# Patient Record
Sex: Female | Born: 1960 | Race: Asian | Hispanic: No | Marital: Married | Smoking: Never smoker
Health system: Southern US, Community
[De-identification: ages and names within clinical notes are randomized; demographics above are authoritative.]

## PROBLEM LIST (undated history)

## (undated) DIAGNOSIS — K37 Unspecified appendicitis: Secondary | ICD-10-CM

## (undated) DIAGNOSIS — R1031 Right lower quadrant pain: Secondary | ICD-10-CM

## (undated) DIAGNOSIS — K5900 Constipation, unspecified: Secondary | ICD-10-CM

## (undated) DIAGNOSIS — D219 Benign neoplasm of connective and other soft tissue, unspecified: Secondary | ICD-10-CM

## (undated) DIAGNOSIS — E785 Hyperlipidemia, unspecified: Secondary | ICD-10-CM

## (undated) DIAGNOSIS — R110 Nausea: Secondary | ICD-10-CM

## (undated) DIAGNOSIS — K649 Unspecified hemorrhoids: Secondary | ICD-10-CM

## (undated) DIAGNOSIS — M1990 Unspecified osteoarthritis, unspecified site: Secondary | ICD-10-CM

## (undated) DIAGNOSIS — Z87898 Personal history of other specified conditions: Secondary | ICD-10-CM

## (undated) HISTORY — PX: APPENDECTOMY: SHX54

## (undated) HISTORY — DX: Constipation, unspecified: K59.00

## (undated) HISTORY — DX: Nausea: R11.0

## (undated) HISTORY — DX: Personal history of other specified conditions: Z87.898

## (undated) HISTORY — DX: Benign neoplasm of connective and other soft tissue, unspecified: D21.9

## (undated) HISTORY — DX: Unspecified osteoarthritis, unspecified site: M19.90

## (undated) HISTORY — DX: Hyperlipidemia, unspecified: E78.5

## (undated) HISTORY — PX: COLONOSCOPY: SHX174

## (undated) HISTORY — PX: FINGER SURGERY: SHX640

## (undated) HISTORY — DX: Unspecified appendicitis: K37

## (undated) HISTORY — PX: POLYPECTOMY: SHX149

## (undated) HISTORY — DX: Right lower quadrant pain: R10.31

## (undated) HISTORY — DX: Unspecified hemorrhoids: K64.9

---

## 1999-10-09 DIAGNOSIS — R110 Nausea: Secondary | ICD-10-CM

## 1999-10-09 DIAGNOSIS — R1031 Right lower quadrant pain: Secondary | ICD-10-CM

## 1999-10-09 HISTORY — DX: Right lower quadrant pain: R10.31

## 1999-10-09 HISTORY — DX: Nausea: R11.0

## 2000-05-24 ENCOUNTER — Other Ambulatory Visit: Admission: RE | Admit: 2000-05-24 | Discharge: 2000-05-24 | Payer: Self-pay | Admitting: Obstetrics & Gynecology

## 2000-08-15 ENCOUNTER — Encounter (INDEPENDENT_AMBULATORY_CARE_PROVIDER_SITE_OTHER): Payer: Self-pay | Admitting: Specialist

## 2000-08-15 ENCOUNTER — Observation Stay (HOSPITAL_COMMUNITY): Admission: EM | Admit: 2000-08-15 | Discharge: 2000-08-16 | Payer: Self-pay | Admitting: Emergency Medicine

## 2001-07-08 ENCOUNTER — Other Ambulatory Visit: Admission: RE | Admit: 2001-07-08 | Discharge: 2001-07-08 | Payer: Self-pay | Admitting: Obstetrics & Gynecology

## 2002-07-28 ENCOUNTER — Other Ambulatory Visit: Admission: RE | Admit: 2002-07-28 | Discharge: 2002-07-28 | Payer: Self-pay | Admitting: Obstetrics & Gynecology

## 2003-06-08 ENCOUNTER — Encounter: Admission: RE | Admit: 2003-06-08 | Discharge: 2003-06-15 | Payer: Self-pay | Admitting: *Deleted

## 2003-09-06 ENCOUNTER — Other Ambulatory Visit: Admission: RE | Admit: 2003-09-06 | Discharge: 2003-09-06 | Payer: Self-pay | Admitting: Obstetrics & Gynecology

## 2004-09-12 ENCOUNTER — Other Ambulatory Visit: Admission: RE | Admit: 2004-09-12 | Discharge: 2004-09-12 | Payer: Self-pay | Admitting: Obstetrics & Gynecology

## 2005-09-19 ENCOUNTER — Other Ambulatory Visit: Admission: RE | Admit: 2005-09-19 | Discharge: 2005-09-19 | Payer: Self-pay | Admitting: Obstetrics & Gynecology

## 2007-06-12 ENCOUNTER — Encounter: Admission: RE | Admit: 2007-06-12 | Discharge: 2007-06-12 | Payer: Self-pay | Admitting: Dermatology

## 2007-07-01 ENCOUNTER — Ambulatory Visit (HOSPITAL_BASED_OUTPATIENT_CLINIC_OR_DEPARTMENT_OTHER): Admission: RE | Admit: 2007-07-01 | Discharge: 2007-07-01 | Payer: Self-pay | Admitting: Orthopedic Surgery

## 2007-07-01 ENCOUNTER — Encounter (INDEPENDENT_AMBULATORY_CARE_PROVIDER_SITE_OTHER): Payer: Self-pay | Admitting: Orthopedic Surgery

## 2009-05-06 ENCOUNTER — Encounter: Payer: Self-pay | Admitting: Family Medicine

## 2009-08-04 ENCOUNTER — Ambulatory Visit: Payer: Self-pay | Admitting: Family Medicine

## 2009-08-04 DIAGNOSIS — D759 Disease of blood and blood-forming organs, unspecified: Secondary | ICD-10-CM | POA: Insufficient documentation

## 2009-08-04 DIAGNOSIS — E785 Hyperlipidemia, unspecified: Secondary | ICD-10-CM | POA: Insufficient documentation

## 2009-08-05 LAB — CONVERTED CEMR LAB
ALT: 24 units/L (ref 0–35)
Alkaline Phosphatase: 52 units/L (ref 39–117)
Basophils Absolute: 0 10*3/uL (ref 0.0–0.1)
Bilirubin, Direct: 0.1 mg/dL (ref 0.0–0.3)
Cholesterol: 189 mg/dL (ref 0–200)
HCT: 40.2 % (ref 36.0–46.0)
Hemoglobin: 13.4 g/dL (ref 12.0–15.0)
LDL Cholesterol: 107 mg/dL — ABNORMAL HIGH (ref 0–99)
Lymphs Abs: 2.1 10*3/uL (ref 0.7–4.0)
MCV: 88.3 fL (ref 78.0–100.0)
Monocytes Absolute: 0.6 10*3/uL (ref 0.1–1.0)
Monocytes Relative: 7.8 % (ref 3.0–12.0)
Neutro Abs: 4.3 10*3/uL (ref 1.4–7.7)
Platelets: 370 10*3/uL (ref 150.0–400.0)
RDW: 14.7 % — ABNORMAL HIGH (ref 11.5–14.6)
Total Bilirubin: 1 mg/dL (ref 0.3–1.2)
VLDL: 22.2 mg/dL (ref 0.0–40.0)

## 2009-08-22 ENCOUNTER — Ambulatory Visit: Payer: Self-pay | Admitting: Family Medicine

## 2010-02-05 LAB — CONVERTED CEMR LAB: Pap Smear: NORMAL

## 2010-08-11 ENCOUNTER — Ambulatory Visit: Payer: Self-pay | Admitting: Family Medicine

## 2010-08-11 LAB — CONVERTED CEMR LAB
AST: 20 units/L (ref 0–37)
Alkaline Phosphatase: 60 units/L (ref 39–117)
Basophils Relative: 0.6 % (ref 0.0–3.0)
Bilirubin Urine: NEGATIVE
Bilirubin, Direct: 0.1 mg/dL (ref 0.0–0.3)
Calcium: 9 mg/dL (ref 8.4–10.5)
Cholesterol: 216 mg/dL — ABNORMAL HIGH (ref 0–200)
Creatinine, Ser: 0.7 mg/dL (ref 0.4–1.2)
Eosinophils Absolute: 0.2 10*3/uL (ref 0.0–0.7)
Eosinophils Relative: 3.2 % (ref 0.0–5.0)
GFR calc non Af Amer: 99.81 mL/min (ref >60)
Ketones, urine, test strip: NEGATIVE
Lymphocytes Relative: 31.7 % (ref 12.0–46.0)
Monocytes Relative: 7.3 % (ref 3.0–12.0)
Neutrophils Relative %: 57.2 % (ref 43.0–77.0)
Nitrite: NEGATIVE
Protein, U semiquant: NEGATIVE
RBC: 4.39 M/uL (ref 3.87–5.11)
Sodium: 141 meq/L (ref 135–145)
Total CHOL/HDL Ratio: 4
Total Protein: 6.5 g/dL (ref 6.0–8.3)
Triglycerides: 167 mg/dL — ABNORMAL HIGH (ref 0.0–149.0)
Urobilinogen, UA: 0.2
VLDL: 33.4 mg/dL (ref 0.0–40.0)
WBC: 6 10*3/uL (ref 4.5–10.5)

## 2010-08-22 ENCOUNTER — Ambulatory Visit: Payer: Self-pay | Admitting: Family Medicine

## 2010-11-07 NOTE — Assessment & Plan Note (Signed)
Summary: cpx//ccm   Vital Signs:  Patient profile:   50 year old female Menstrual status:  regular Height:      62.50 inches Weight:      126 pounds BMI:     22.76 Temp:     98.6 degrees F oral Pulse rate:   72 / minute Pulse rhythm:   regular Resp:     12 per minute BP sitting:   100 / 64  (left arm) Cuff size:   regular  Vitals Entered By: Sid Falcon LPN (August 22, 2010 10:05 AM)  History of Present Illness: Here for CPE.  Patient sees gynecologist and had Pap smear and mammogram last May which were normal. She has history of hyperlipidemia and reported history of mild thrombocytosis. She has also been diagnosed with uterine fibroids followed closely by gynecologist. Sometimes has heavy menses.  Family history mother had lung cancer. Father died of stroke complications and had type 2 diabetes. Brothers with hypertension and hyperlipidemia.  Last tetanus unknown. She thinks greater than 10 years. Exercises 4 days per week.  Clinical Review Panels:  Prevention   Last Mammogram:  normal (02/05/2010)   Last Pap Smear:  normal (02/05/2010)  Immunizations   Last Tetanus Booster:  Tdap (08/22/2010)   Last Flu Vaccine:  Historical (08/08/2010)  Lipid Management   Cholesterol:  216 (08/11/2010)   LDL (bad choesterol):  107 (08/04/2009)   HDL (good cholesterol):  59.00 (08/11/2010)  Diabetes Management   Creatinine:  0.7 (08/11/2010)   Last Flu Vaccine:  Historical (08/08/2010)  CBC   WBC:  6.0 (08/11/2010)   RBC:  4.39 (08/11/2010)   Hgb:  12.8 (08/11/2010)   Hct:  38.8 (08/11/2010)   Platelets:  379.0 (08/11/2010)   MCV  88.4 (08/11/2010)   MCHC  33.0 (08/11/2010)   RDW  13.7 (08/11/2010)   PMN:  57.2 (08/11/2010)   Lymphs:  31.7 (08/11/2010)   Monos:  7.3 (08/11/2010)   Eosinophils:  3.2 (08/11/2010)   Basophil:  0.6 (08/11/2010)  Complete Metabolic Panel   Glucose:  100 (08/11/2010)   Sodium:  141 (08/11/2010)   Potassium:  4.4 (08/11/2010)  Chloride:  106 (08/11/2010)   CO2:  29 (08/11/2010)   BUN:  14 (08/11/2010)   Creatinine:  0.7 (08/11/2010)   Albumin:  4.0 (08/11/2010)   Total Protein:  6.5 (08/11/2010)   Calcium:  9.0 (08/11/2010)   Total Bili:  0.5 (08/11/2010)   Alk Phos:  60 (08/11/2010)   SGPT (ALT):  18 (08/11/2010)   SGOT (AST):  20 (08/11/2010)   Allergies: No Known Drug Allergies  Past History:  Past Surgical History: Last updated: 08/04/2009 C-Section X 2  Family History: Last updated: 08/22/2010 Family History Diabetes Father Family History Hypertension  Brothers Family History Lung cancer Mother Family History of Stroke Father died 42  Social History: Last updated: 08/04/2009 Occupation:  Homemaker Married Never Smoked Alcohol use-no Regular exercise-yes  Risk Factors: Exercise: yes (08/04/2009)  Risk Factors: Smoking Status: never (08/04/2009)  Past Medical History: Chicken pox Hyperlipidemia uterine fibroids PMH-FH-SH reviewed for relevance  Family History: Family History Diabetes Father Family History Hypertension  Brothers Family History Lung cancer Mother Family History of Stroke Father died 69  Review of Systems  The patient denies anorexia, fever, weight loss, weight gain, vision loss, decreased hearing, hoarseness, chest pain, syncope, dyspnea on exertion, peripheral edema, prolonged cough, headaches, hemoptysis, abdominal pain, melena, hematochezia, severe indigestion/heartburn, hematuria, incontinence, genital sores, muscle weakness, suspicious skin lesions, transient blindness, difficulty walking,  depression, unusual weight change, abnormal bleeding, enlarged lymph nodes, and breast masses.    Physical Exam  General:  Well-developed,well-nourished,in no acute distress; alert,appropriate and cooperative throughout examination Head:  Normocephalic and atraumatic without obvious abnormalities. No apparent alopecia or balding. Eyes:  No corneal or conjunctival  inflammation noted. EOMI. Perrla. Funduscopic exam benign, without hemorrhages, exudates or papilledema. Vision grossly normal. Ears:  External ear exam shows no significant lesions or deformities.  Otoscopic examination reveals clear canals, tympanic membranes are intact bilaterally without bulging, retraction, inflammation or discharge. Hearing is grossly normal bilaterally. Mouth:  Oral mucosa and oropharynx without lesions or exudates.  Teeth in good repair. Neck:  No deformities, masses, or tenderness noted. Breasts:  gyn Lungs:  Normal respiratory effort, chest expands symmetrically. Lungs are clear to auscultation, no crackles or wheezes. Heart:  Normal rate and regular rhythm. S1 and S2 normal without gallop, murmur, click, rub or other extra sounds. Abdomen:  Bowel sounds positive,abdomen soft and non-tender without masses, organomegaly or hernias noted. Genitalia:  gyn Msk:  No deformity or scoliosis noted of thoracic or lumbar spine.   Extremities:  No clubbing, cyanosis, edema, or deformity noted with normal full range of motion of all joints.   Neurologic:  alert & oriented X3 and gait normal.   Skin:  no rashes and no suspicious lesions.   Cervical Nodes:  No lymphadenopathy noted Psych:  normally interactive, good eye contact, not anxious appearing, and not depressed appearing.     Impression & Recommendations:  Problem # 1:  Preventive Health Care (ICD-V70.0) labs reviewed with pt.  Tdap given.  Problem # 2:  HYPERLIPIDEMIA (ICD-272.4)  Her updated medication list for this problem includes:    Crestor 10 Mg Tabs (Rosuvastatin calcium) ..... One by mouth once daily  Complete Medication List: 1)  Crestor 10 Mg Tabs (Rosuvastatin calcium) .... One by mouth once daily 2)  Bayer Low Strength 81 Mg Tbec (Aspirin) .... Once daily  Other Orders: Tdap => 28yrs IM (16109) Admin 1st Vaccine (60454)  Patient Instructions: 1)  Please schedule a follow-up appointment in 1 year.    2)  It is important that you exercise reguarly at least 20 minutes 5 times a week. If you develop chest pain, have severe difficulty breathing, or feel very tired, stop exercising immediately and seek medical attention.  3)  Continue regular gyn follow up Prescriptions: CRESTOR 10 MG TABS (ROSUVASTATIN CALCIUM) one by mouth once daily  #30 x 6   Entered and Authorized by:   Evelena Peat MD   Signed by:   Evelena Peat MD on 08/22/2010   Method used:   Electronically to        [facility]  [place] [address] (retail)       [address]       [place], [place]  [address]       Ph: [phone] or [phone]       Fax: [phone]   RxID:   [id number]    Orders Added: 1)  Tdap => 48yrs IM [90715] 2)  Admin 1st Vaccine [90471] 3)  Est. Patient 40-64 years [34742]   Immunization History:  Influenza Immunization History:    Influenza:  historical (08/08/2010)  Immunizations Administered:  Tetanus Vaccine:    Vaccine Type: Tdap    Site: left deltoid    Mfr: GlaxoSmithKline    Dose: 0.5 ml    Route: IM    Given by: Sid Falcon LPN    Exp.  Date: 06/27/2012    Lot #: [id number]    VIS given: 08/25/08 version given August 22, 2010.   Immunization History:  Influenza Immunization History:    Influenza:  Historical (08/08/2010)  Immunizations Administered:  Tetanus Vaccine:    Vaccine Type: Tdap    Site: left deltoid    Mfr: GlaxoSmithKline    Dose: 0.5 ml    Route: IM    Given by: Sid Falcon LPN    Exp. Date: 06/27/2012    Lot #: [id number]    VIS given: 08/25/08 version given August 22, 2010.   Preventive Care Screening  Mammogram:    Date:  02/05/2010    Results:  normal   Pap Smear:    Date:  02/05/2010    Results:  normal

## 2011-01-31 ENCOUNTER — Other Ambulatory Visit: Payer: Self-pay | Admitting: Obstetrics and Gynecology

## 2011-01-31 DIAGNOSIS — R1900 Intra-abdominal and pelvic swelling, mass and lump, unspecified site: Secondary | ICD-10-CM

## 2011-02-06 ENCOUNTER — Ambulatory Visit
Admission: RE | Admit: 2011-02-06 | Discharge: 2011-02-06 | Disposition: A | Discharge status: Home / Self Care | Payer: BC Managed Care – PPO | Source: Ambulatory Visit | Attending: Obstetrics and Gynecology | Admitting: Obstetrics and Gynecology

## 2011-02-06 DIAGNOSIS — R1900 Intra-abdominal and pelvic swelling, mass and lump, unspecified site: Secondary | ICD-10-CM

## 2011-02-06 MED ORDER — IOHEXOL 300 MG/ML  SOLN
100.0000 mL | Freq: Once | INTRAMUSCULAR | Status: AC | PRN
  Administered 2011-02-06: 100 mL via INTRAVENOUS

## 2011-02-20 NOTE — Op Note (Signed)
NAMESHAHIDAH, Shelley Rhodes                     ACCOUNT NO.:  [id number]   MEDICAL RECORD NO.:  [id number]          PATIENT TYPE:  AMB   LOCATION:  DSC                          FACILITY:  [facility]   PHYSICIAN:  Katy Fitch. Sypher, M.D. DATE OF BIRTH:  June 14, 1961   DATE OF PROCEDURE:  07/01/2007  DATE OF DISCHARGE:                               OPERATIVE REPORT   PREOPERATIVE DIAGNOSIS:  Manson Passey discolored, deformed right ring finger  ulnar nail plate with inflammation of nail fold, rule out melanotic  lesion versus fungal infection versus glomus tumor, right ring nail fold  and nail matrix.   POSTOPERATIVE DIAGNOSIS:  Probable fungal nail infection, rule out  glomus tumor.   OPERATION:  1. Right ring finger nail plate excision with nail plate being sent to      lab for KOH prep to look for fungal species followed by nail plate      culture.  2. Excisional biopsy of nail matrix to rule out a possible glomus      tumor.   SURGEON:  Katy Fitch. Sypher, M.D.   ASSISTANT:  Marveen Reeks. Dasnoit, P.A.-C.   ANESTHESIA:  2% lidocaine metacarpal head level block of right ring  finger supplemented by IV sedation followed by a postoperative 0.25%  Marcaine digital block for post surgical comfort.   INDICATIONS:  Shelley Rhodes is a 50 year old woman referred through the  courtesy of Dr. Venancio Poisson, dermatologist, for evaluation and  management of a right ring finger nail deformity.  Shelley Rhodes had noted a  brownish discoloration of the ulnar 3 mm of her nail plate with a groove  and discomfort along the nail wall.  Dr. Nicholas Lose reviewed this and was  concerned about a possible melanin producing lesion versus trauma versus  possible fungal infection.  She referred Shelley Rhodes for an orthopedic hand  surgery consult and possible nail biopsy.   On examination in the office, Shelley Rhodes was noted to have a nail groove  with evidence of hemosiderin staining verses melanin staining of the  nail plate extending back to the germinal nail  matrix.  Due to our  inability to rule out a melanin producing lesion, we recommended  proceeding with excisional biopsy at this time.  After informed consent,  Shelley Rhodes is brought to operating room.  Preoperatively, she was advised  that she would possibly have narrowing of her nail plate and matrix  following the biopsy.  She also understands that she may have a chronic  nail irregularity after nail biopsy.   PROCEDURE:  Shelley Rhodes is brought to the operating room and placed in the  supine position on the operating table.  Following the induction of  general sedation, the right arm was prepped with Betadine soap solution  and sterilely draped.  2% lidocaine was infiltrated at the metacarpal  head level to obtain digital block.  The procedure commenced with  exsanguination of the right ring finger with a gauze wrap and placement  of a 1/2 inch Penrose drain as a digital tourniquet over the proximal  phalangeal  segment of the finger.   The ulnar nail plate was elevated with a micro-Freer and a segment of  the nail measuring 3 mm in width was excised incorporating all of the  brownish discoloration.  The nail was inverted and the deep surface  inspected.  There appeared to be some hyperkeratosis of the nail plate  as well as some irregularity of the nail matrix suggesting either fungal  infection of prior trauma.  The nail plate was sent to pathology for a  KOH prep and culture of the nail plate.  The nail matrix was carefully  inspected and a purplish area was identified adjacent to the abnormal  appearing nail that could have represented a possible glomus tumor.  The  nail deformity was circumferentially dissected and excised.  This was  passed off in formalin for pathologic evaluation.   The margins of the sterile nail matrix was then elevated, mobilized, and  repaired with interrupted sutures of 7-0 chromic.  The dorsal nail fold  was inspected proximally and there was no sign of a mass  or other  melanin containing lesion on the deep surface of the nail fold or in the  germinal nail matrix.  The wound was then irrigated, the nail plate  replaced anatomically, and the finger dressed with Xeroflow, sterile  gauze, and Coban.  A 0.25% Marcaine block without epinephrine was placed  for postoperative analgesia at the metacarpal head level.  There were no  apparent complications with the block.   Shelley Rhodes was awakened from sedation and transferred to the recovery room  with stable vital signs.  She will be discharged home with a  prescription for Percocet 5 mg one p.o. q.4-6h. p.r.n. pain, 20 tablets  without refill.      Katy Fitch Sypher, M.D.  Electronically Signed     RVS/MEDQ  D:  07/01/2007  T:  07/01/2007  Job:  [id number]   cc:   Venancio Poisson, M.D.

## 2011-02-23 NOTE — H&P (Signed)
[facility]  Patient:    Shelley Rhodes, Shelley Rhodes                           MRN: [id number] Adm. Date:  08/15/00 Attending:  Angelia Mould. Derrell Lolling, M.D. CC:         Al Decant. Janey Greaser, M.D.   History and Physical  CHIEF COMPLAINT:  Right lower quadrant abdominal pain and nausea.  HISTORY OF PRESENT ILLNESS:  This is a 50 year old [place] female, previously in excellent health.  Last night after dinner, she developed a diffuse centralized abdominal pain, dizziness, and nausea, but she did not vomit.  She did not sleep well because of pain.  The pain has become progressive and has become more localized and more intense in the right lower quadrant.  She had two small semi-formed stools over the past 24 hours.  She has not had any prior similar episodes of pain like this.  She denies any prior gastrointestinal problems.  Her last menstrual period was July 17, 2000 and was normal.  She has no voiding symptoms.  She was seen by Dr. Doran Clay in his office today.  He was concerned that she had appendicitis.  A CBC was obtained and showed a white blood cell count of 15,000 and a hemoglobin of 15.0.  Urinalysis was normal.  Urine pregnancy test was negative.  Complete metabolic panel is normal.  She was admitted with a tentative diagnosis of acute appendicitis.  PAST MEDICAL HISTORY:  She has a history of heartburn.  She has had two cesarean sections, one in 1988 and another in 1993.  She has not had any surgical problems otherwise.  She has no medical problems.  CURRENT MEDICATIONS:  None.  DRUG ALLERGIES:  None known.  SOCIAL HISTORY:  The patient lives in [place] with her husband and two children.  She denies the use of alcohol or tobacco.  She is a housewife.  Her husband works for _________ as a [employer] manager.  FAMILY HISTORY:  Mother deceased of lung cancer.  Father living, had a stroke, hypertension, and diabetes.  Two brothers living and  well.  REVIEW OF SYSTEMS:  All systems are reviewed and are negative except as described above.  PHYSICAL EXAMINATION:  GENERAL:  Healthy young [place] female in moderate distress with abdominal pain.  VITAL SIGNS:  Temperature 97.2, pulse 80, respirations 16, blood pressure 106/71.  HEENT:  Sclerae clear.  Extraocular movements intact.  Oropharynx clear without lesions.  NECK:  Supple, nontender, no mass, no adenopathy, no thyromegaly, no bruit.  LUNGS:  Clear to auscultation, no CVA tenderness.  HEART:  Regular rate and rhythm, no murmur.  ABDOMEN:  Not distended.  Soft.  Hypoactive bowel sounds.  She has marked localized tenderness, involuntary guarding, direct and referred rebound in the right lower quadrant.  There is no mass.  There is no hernia.  GENITALIA:  Pelvic exam unremarkable according to Dr. Janey Greaser.  EXTREMITIES:  No edema, good pulses.  NEUROLOGIC:  Grossly within normal limits.  IMPRESSION:  Acute appendicitis.  PLAN: 1. The patient will be taken to the operating room for diagnostic laparoscopy    and probably laparoscopic appendectomy. 2. I have discussed the differential diagnosis with the patient and her    husband.  They are aware that this is most likely appendicitis, but I    cannot rule out diverticulitis, salpingitis, or other surgical problems.    The differential diagnosis was discussed in  detail.  I discussed the    indications and details of surgery with them.  Risks and complications,    including but not limited to, bleeding, infection, wound problems,    conversion to open laparotomy, possible intestinal surgery, and other    known preceding complications.  They seem to understand these issues quite    well.  At this time all of their questions were answered.  They agree with    the plan. DD:  08/15/00 TD:  08/15/00 Job: [id number]

## 2011-02-23 NOTE — Op Note (Signed)
[facility]  Patient:    Shelley Rhodes, Shelley Rhodes                           MRN: [id number] Proc. Date: 08/15/00 Adm. Date:  [id number] Attending:  Brandy Hale CC:         Al Decant. Janey Greaser, M.D.   Operative Report  PREOPERATIVE DIAGNOSIS:  Acute appendicitis.  POSTOPERATIVE DIAGNOSIS:  Acute appendicitis.  OPERATION:  Laparoscopic appendectomy.  SURGEON:  Angelia Mould. Derrell Lolling, M.D.  INDICATIONS:  This is a 50 year old [place] female who has enjoyed good health.  She presents today with an 18 to 20-hour history of lower abdominal pain which has been progressive and localized in the right lower quadrant.  On examination, she was very tender in the right lower quadrant.  White blood cell count was elevated to 15,000.  I felt that she had acute appendicitis, and she is brought to the operating room urgently.  INTRAOPERATIVE FINDINGS:  The patient had acute appendicitis.  The appendix was swollen, thickened, and had an exudate on it.  There was no evidence of rupture.  The colon, small intestine, uterus, and ovaries looked fine.  The liver and gallbladder looked normal.  OPERATIVE TECHNIQUE:  Following the induction of general endotracheal anesthesia, the patients abdomen was prepped and draped in sterile fashion. A Foley catheter had been inserted previously.  Marcaine 0.5% with epinephrine was used as a local infiltration anesthetic.  A vertical incision was made in the upper rim of the umbilicus.  The fascia was incised in the midline and the abdominal cavity entered under direct vision.  A 10 mm Hasson trocar was inserted and secured with a pursestring suture of 0 Vicryl.  Pneumoperitoneum was created.  Video camera was inserted with visualization and findings as described above.  A 5 mm trocar was placed in the right upper quadrant and a 12 mm trocar placed in the left lower quadrant.  The appendix was identified, mobilized, and controlled with an  endoloop tie.  Harmonic scalpel was used, and we divided the lateral peritoneal attachments to further mobilize the appendix.  The mesoappendix was divided with a harmonic scalpel.  We took all of this down until we could clearly identify the junction of the appendix with the cecum.  We inserted a Endo GIA stapling device using the small vascular staple load.  This was positioned across the base of the appendix, and the GIA stapler was closed, fired, and removed.  This provided a very secure closure on the cecum.  The appendix was placed in a specimen bag and removed.  The operative field and the right paracolic gutter and the pelvis were irrigated with saline.  The fluid was clear.  There was no bleeding.  Trocars were removed under direct vision, and there was no bleeding from the trocar sites. The pneumoperitoneum was released.  The fascia at the umbilicus was closed with 0 Vicryl suture.  The fascia in the left lower quadrant trocar site was closed with 0 Vicryl suture.  The incisions were irrigated with saline and the skin closed with subcuticular sutures of 4-0 Vicryl and Steri-Strips.  Clean bandages were placed and the patient taken to the recovery room in stable condition.  Estimated blood loss was about 10 cc.  Complications: None. Sponge, needle, and instrument counts were correct. DD:  08/15/00 TD:  08/16/00 Job: [id number]

## 2011-03-30 ENCOUNTER — Ambulatory Visit: Payer: BC Managed Care – PPO | Attending: Gynecology | Admitting: Gynecology

## 2011-03-30 DIAGNOSIS — E785 Hyperlipidemia, unspecified: Secondary | ICD-10-CM | POA: Insufficient documentation

## 2011-03-30 DIAGNOSIS — D259 Leiomyoma of uterus, unspecified: Secondary | ICD-10-CM | POA: Insufficient documentation

## 2011-03-30 DIAGNOSIS — R971 Elevated cancer antigen 125 [CA 125]: Secondary | ICD-10-CM | POA: Insufficient documentation

## 2011-04-02 NOTE — Consult Note (Signed)
Shelley Rhodes, Shelley Rhodes                     ACCOUNT NO.:  [id number]  MEDICAL RECORD NO.:  [id number]  LOCATION:  GYN                          FACILITY:  [facility]  PHYSICIAN:  De Blanch, M.D.DATE OF BIRTH:  12-Nov-1960  DATE OF CONSULTATION: DATE OF DISCHARGE:                                CONSULTATION   CHIEF COMPLAINT:  Elevated CA-125.  HISTORY OF PRESENT ILLNESS:  A 50 year old Asian woman seen in consultation at the request of Dr. Miguel Aschoff regarding elevated CA-125.  The patient was apparently visiting in [place], [place] in 2010 and underwent a complete physical exam, which included drawing of a CA-125 value.  At that time, the CA-125 returned 228 units/mL.  The patient recalls at that time she was having a heavy menstrual period and she has known uterine fibroids.  Subsequently, she came back to [place] where she has lived for 20 years.  Followup CA-125 in April 2010 was 49, in February 2011 was 39, and on Shianne 5, 2012 was 25.  Until recently, the patient was having regular cyclic menstrual periods.  More recently, she has been having prolonged time between her periods.  She has had some heavy menstrual periods in the past, but more recently have been relatively normal.  She denies any past history of any other gynecologic problems.  The patient has had a series of pelvic ultrasounds, which have documented uterine fibroids.  More recently, she had a CT scan of the abdomen and pelvis, which aside from the uterine fibroids showed a 3.3 cm left ovarian cyst.  There is no evidence of ascites, carcinomatosis or any adenopathy.  PAST MEDICAL HISTORY:  Medical illnesses, hyperlipidemia.  PAST SURGICAL HISTORY:  Cesarean section x2, appendectomy and surgery on her finger.  CURRENT MEDICATIONS:  Crestor.  DRUG ALLERGIES:  None.  OBSTETRICAL HISTORY:  Gravida 2.  FAMILY HISTORY:  Negative for gynecologic, breast or colon cancer.  SOCIAL HISTORY:  The patient is  married.  She does not smoke.  REVIEW OF SYSTEMS:  10-point comprehensive review of systems is negative except as noted above.  PHYSICAL EXAMINATION:  VITAL SIGNS:  Weight 128 pounds, height 5 feet 3 inches, blood pressure 106/60, pulse 62, respiratory rate 14, temperature of 98.1. GENERAL:  The patient is a healthy Asian female, in no acute distress. HEENT:  Negative. NECK:  Supple without thyromegaly.  There is no supraclavicular or inguinal adenopathy. ABDOMEN:  Soft, nontender.  No masses, organomegaly, ascites, or hernias are noted.  Pfannenstiel incision is well-healed. PELVIC:  EGBUS, vagina, urethra are normal.  Cervix appears normal on bimanual examination.  The uterus is irregular and approximately 12 to 13 weeks' gestational size. LOWER EXTREMITIES:  Without edema or varicosities.  IMPRESSION:  Elevated CA-125.  Most recent CA-125 actually was normal. I had a lengthy discussion with the patient and her husband regarding CA- 125.  First, I emphasized it was not a screening test.  Secondly that, CA-125 could be elevated for a number of reasons including uterine fibroids and menstrual periods.  It is my belief based on the extensive scanning of her pelvis that she does not have ovarian cancer and the CA-  125 was falsely elevated.  I would recommend that we discontinue obtaining CA-125 values and the patient return to see Dr. Tenny Craw on an annual basis.  We did talk briefly about her perimenopausal state and the irregularity of her menstrual periods.     De Blanch, M.D.     DC/MEDQ  D:  03/30/2011  T:  03/30/2011  Job:  [id number]  cc:   Miguel Aschoff, M.D.  Telford Nab, R.N. [address] [place], [place] [address]  Electronically Signed by De Blanch M.D. on 04/02/2011 01:02:42 PM

## 2011-04-09 ENCOUNTER — Other Ambulatory Visit: Payer: Self-pay | Admitting: *Deleted

## 2011-04-09 MED ORDER — ROSUVASTATIN CALCIUM 10 MG PO TABS: 10.0000 mg | ORAL_TABLET | Freq: Every day | ORAL | Status: DC

## 2011-07-19 LAB — CULTURE, FUNGUS WITHOUT SMEAR

## 2011-07-19 LAB — KOH PREP

## 2011-08-15 ENCOUNTER — Ambulatory Visit (INDEPENDENT_AMBULATORY_CARE_PROVIDER_SITE_OTHER): Payer: BC Managed Care – PPO | Admitting: Family Medicine

## 2011-08-15 ENCOUNTER — Encounter: Payer: Self-pay | Admitting: Family Medicine

## 2011-08-15 DIAGNOSIS — R002 Palpitations: Secondary | ICD-10-CM

## 2011-08-15 DIAGNOSIS — E785 Hyperlipidemia, unspecified: Secondary | ICD-10-CM

## 2011-08-15 DIAGNOSIS — R0789 Other chest pain: Secondary | ICD-10-CM

## 2011-08-15 LAB — LIPID PANEL
Cholesterol: 222 mg/dL — ABNORMAL HIGH (ref 0–200)
Total CHOL/HDL Ratio: 3
Triglycerides: 90 mg/dL (ref 0.0–149.0)
VLDL: 18 mg/dL (ref 0.0–40.0)

## 2011-08-15 LAB — HEPATIC FUNCTION PANEL
Albumin: 4.4 g/dL (ref 3.5–5.2)
Alkaline Phosphatase: 64 U/L (ref 39–117)

## 2011-08-15 LAB — LDL CHOLESTEROL, DIRECT: Direct LDL: 145 mg/dL

## 2011-08-15 NOTE — Progress Notes (Signed)
  Subjective:    Patient ID: Shelley Rhodes, female    DOB: 01/28/1961, 49 y.o.   MRN: [id number]  HPI  Acute visit. Patient seen as a work in with intermittent episodes of atypical chest pain and mostly palpitations and sense of racing heart since last Thursday. Symptoms occur at rest. She had sensation of irregular beat but no exertionalchest pain or dyspnea.  Recurrent episode Saturday again at rest. No dizziness or syncope. Did have some sensation of shortness of breath Saturday with mild left precordial sharp pain occasionally with breathing. Again no exertional quality. No radiation to neck or arm. She denies any recent cough, fever, hemoptysis, appetite, or weight changes. No caffeine use. No decongestant use. Patient is perimenopausal.  Husband states she's had somewhat similar reaction previously possibly to stress. Only recent stress issue is daughter with some relatively mild health problems. Patient has no cardiac history. Does have history of hyperlipidemia. Takes low-dose Crestor. Nonsmoker. No history of diabetes or hypertension.      Review of Systems  Constitutional: Negative for fever, chills and unexpected weight change.  Respiratory: Negative for cough, choking and wheezing.   Cardiovascular: Positive for chest pain and palpitations. Negative for leg swelling.  Gastrointestinal: Negative for abdominal pain.  Neurological: Negative for dizziness, syncope, weakness and headaches.       Objective:   Physical Exam  Constitutional: She is oriented to person, place, and time. She appears well-developed and well-nourished. No distress.  HENT:  Right Ear: External ear normal.  Left Ear: External ear normal.  Mouth/Throat: Oropharynx is clear and moist.  Neck: Neck supple. No thyromegaly present.  Cardiovascular: Normal rate, regular rhythm and normal heart sounds.   No murmur heard. Pulmonary/Chest: Effort normal and breath sounds normal. No respiratory distress. She has no  wheezes. She has no rales.  Musculoskeletal: She exhibits no edema.  Lymphadenopathy:    She has no cervical adenopathy.  Neurological: She is alert and oriented to person, place, and time.          Assessment & Plan:  Patient presents with intermittent palpitations and atypical chest symptoms. Question anxiety related. EKG shows sinus rhythm with no acute changes. Check labs with lipid panel, hepatic panel, and TSH. Discussed possible Holter monitoring and will defer to cardiologist. She has seen local cardiologist recently and will set up. Avoid caffeine use. She's had some mild intermittent atypical chest symptoms. Very low clinical suspicion for PE but check d-dimer. No recent travels.

## 2011-08-16 LAB — D-DIMER, QUANTITATIVE: D-Dimer, Quant: 0.33 ug/mL-FEU (ref 0.00–0.48)

## 2011-08-16 NOTE — Progress Notes (Signed)
Quick Note:  Pt informed ______ 

## 2011-08-27 ENCOUNTER — Encounter: Payer: Self-pay | Admitting: Cardiovascular Disease

## 2011-08-27 ENCOUNTER — Ambulatory Visit (INDEPENDENT_AMBULATORY_CARE_PROVIDER_SITE_OTHER): Payer: BC Managed Care – PPO | Admitting: Cardiovascular Disease

## 2011-08-27 DIAGNOSIS — I493 Ventricular premature depolarization: Secondary | ICD-10-CM

## 2011-08-27 DIAGNOSIS — I4949 Other premature depolarization: Secondary | ICD-10-CM

## 2011-08-27 DIAGNOSIS — R079 Chest pain, unspecified: Secondary | ICD-10-CM | POA: Insufficient documentation

## 2011-08-27 DIAGNOSIS — R002 Palpitations: Secondary | ICD-10-CM

## 2011-08-27 LAB — BASIC METABOLIC PANEL
Calcium: 8.7 mg/dL (ref 8.4–10.5)
GFR: 90.01 mL/min (ref >60.00)
Sodium: 139 mEq/L (ref 135–145)

## 2011-08-27 MED ORDER — ROSUVASTATIN CALCIUM 10 MG PO TABS: 10.0000 mg | ORAL_TABLET | Freq: Every day | ORAL | Status: DC

## 2011-08-27 MED ORDER — PROPRANOLOL HCL 10 MG PO TABS: 10.0000 mg | ORAL_TABLET | Freq: Four times a day (QID) | ORAL | Status: DC

## 2011-08-27 NOTE — Patient Instructions (Addendum)
Your physician wants you to follow-up in: 2 months or sooner if needed,  You will receive a reminder letter in the mail two months in advance. If you don't receive a letter, please call our office to schedule the follow-up appointment.  Your physician has requested that you have a stress echocardiogram. For further information please visit [email or web address] Please follow instruction sheet as given.   Your physician has recommended that you wear an event monitor. Event monitors are medical devices that record the heart's electrical activity. Doctors most often [place] these monitors to diagnose arrhythmias. Arrhythmias are problems with the speed or rhythm of the heartbeat. The monitor is a small, portable device. You can wear one while you do your normal daily activities. This is usually used to diagnose what is causing palpitations/syncope (passing out).   Your physician recommends that you have lab work today. BMP basic metabolic panel  Your physician has recommended you make the following change in your medication:  1) increase crestor to 10mg  a day 2) start propranolol 10 mg as needed for palpitations, may take one every thirty minutes up to four tablets for palpitations.

## 2011-08-27 NOTE — Assessment & Plan Note (Signed)
Shelley Rhodes presents with some episodes of chest pain. She's had a negative stress test many years ago. We will refer her for a stress echocardiogram for further evaluation. I suspect that her pain is noncardiac

## 2011-08-27 NOTE — Assessment & Plan Note (Signed)
She presents with episodes of palpitations which clinically may be due to premature ventricular contractions. We will place an event monitor on her for further evaluation.

## 2011-08-27 NOTE — Progress Notes (Signed)
  Shelley Rhodes Date of Birth  May 15, 1961 [facility] [address]    [address] [place], [place]  [address] [phone]  Fax  [phone]  History of Present Illness:  Shelley Rhodes is a 50 y.o. female with a past hx of palpitations.  She had stress echo which was normal. Recently she was walking and developed mid sternal chest pain.  The pain radiated to the left side. No pleuretic component.  The pain was sharp - like a pin.   No diaphoresis.  Associated with palpitations.  The symptoms lasted for a few minutes.   No current outpatient prescriptions on file prior to visit.    No Known Allergies  Past Medical History  Diagnosis Date  . Hyperlipidemia   . Fibroids     uterine fibroids  . Hyperlipidemia   . Right lower quadrant abdominal pain 2001  . Nausea 2001  . History of heartburn   . Appendicitis     Past Surgical History  Procedure Date  . Cesarean section     x2  . Appendectomy appendicitis  . Finger surgery     History  Smoking status  . Never Smoker   Smokeless tobacco  . Not on file    History  Alcohol Use: Not on file    Family History  Problem Relation Age of Onset  . Cancer Mother     Lung  . Stroke Father   . Hypertension Father   . Diabetes Father     Reviw of Systems:  Reviewed in the HPI.  All other systems are negative.  Physical Exam: BP 130/84  Pulse 56  Ht 5\' 3"  (1.6 m)  Wt 126 lb 6.4 oz (57.335 kg)  BMI 22.39 kg/m2 The patient is alert and oriented x 3.  The mood and affect are normal.   Skin: warm and dry.  Color is normal.    HEENT:   Somewhat/atraumatic. Her mucous membranes are moist. Her neck is supple. No JVD. Her carotid arteries are normal.  Lungs: Lungs are clear.   Heart: Heart regular rate S1-S2 appear    Abdomen: Her abdomen reveals good bowel sounds. There is no hepatosplenomegaly. There are no bruits or masses the  Extremities:  No clubbing cyanosis or edema  Neuro:  Exam is nonfocal. Cranial nerves II  through XII are intact.    ECG: EKG from his medical doctor's office reveals sinus bradycardia. There are no ST or T wave changes.  Assessment / Plan:

## 2011-09-05 ENCOUNTER — Ambulatory Visit (HOSPITAL_COMMUNITY): Payer: BC Managed Care – PPO | Attending: Internal Medicine | Admitting: Radiology

## 2011-09-05 ENCOUNTER — Ambulatory Visit (HOSPITAL_BASED_OUTPATIENT_CLINIC_OR_DEPARTMENT_OTHER): Payer: BC Managed Care – PPO | Admitting: Radiology

## 2011-09-05 ENCOUNTER — Encounter (INDEPENDENT_AMBULATORY_CARE_PROVIDER_SITE_OTHER): Payer: BC Managed Care – PPO

## 2011-09-05 DIAGNOSIS — R002 Palpitations: Secondary | ICD-10-CM

## 2011-09-05 DIAGNOSIS — R072 Precordial pain: Secondary | ICD-10-CM

## 2011-09-05 DIAGNOSIS — I493 Ventricular premature depolarization: Secondary | ICD-10-CM

## 2011-09-05 DIAGNOSIS — E785 Hyperlipidemia, unspecified: Secondary | ICD-10-CM | POA: Insufficient documentation

## 2011-09-05 DIAGNOSIS — R0989 Other specified symptoms and signs involving the circulatory and respiratory systems: Secondary | ICD-10-CM

## 2011-09-06 ENCOUNTER — Telehealth: Payer: Self-pay | Admitting: *Deleted

## 2011-09-06 NOTE — Telephone Encounter (Signed)
PT CALLED TO SEE WHY SHE REMOVED MONITOR, PT STATED IT WAS UNCOMFORTABLE, SHE COULDN'T SLEEP AND THE PADS MADE HER ITCH AND SHE DIDN'T WANT TO WEAR IT. i INFORMED HER THIS IS VITAL INFORMATION FOR THE DR, WE WILL SEE WHAT HER STRESS TEST SHOWS AND G FROM THERE, PT VERBALIZED UNDERSTANDING BUT STILL DECLINED TO WEAR.

## 2011-09-20 ENCOUNTER — Ambulatory Visit (INDEPENDENT_AMBULATORY_CARE_PROVIDER_SITE_OTHER): Payer: BC Managed Care – PPO | Admitting: Family Medicine

## 2011-09-20 DIAGNOSIS — Z23 Encounter for immunization: Secondary | ICD-10-CM

## 2011-10-29 ENCOUNTER — Ambulatory Visit: Payer: BC Managed Care – PPO | Admitting: Cardiovascular Disease

## 2011-12-05 ENCOUNTER — Other Ambulatory Visit: Payer: Self-pay | Admitting: Family Medicine

## 2012-04-12 IMAGING — CT CT ABD-PELV W/ CM
3 of 5 series · 12 of 36 positions shown, 19 images · IV contrast (READICAT/WATER & [ID] OMNI 300)
Comparison: None.

CLINICAL DATA: Pelvic mass and elevated X2-0RK

CT ABDOMEN AND PELVIS WITH CONTRAST
TECHNIQUE: Multidetector CT imaging of the abdomen and pelvis was
performed following the standard protocol during bolus
administration of intravenous contrast.
Contrast: 100 ml Omni 300

[Series 3: routine abdomen · axial · 0.62mm/px · z∈[-362,+8]mm · 9 of 94 slices shown, 15 images]
[im 10/94  soft-tissue]
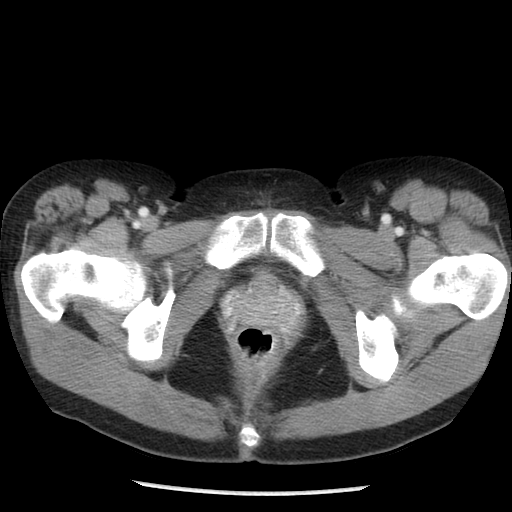
[im 10/94  bone]
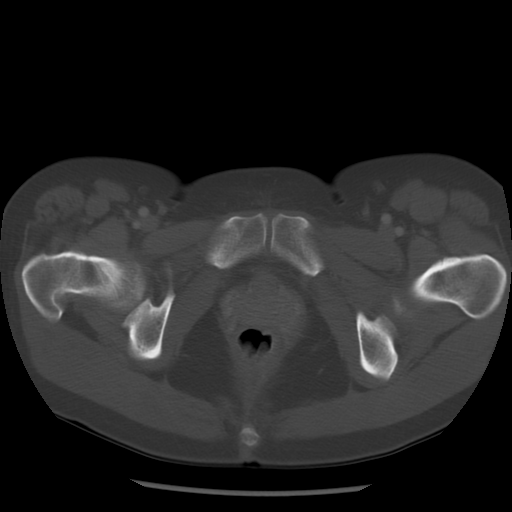
[im 19/94  soft-tissue]
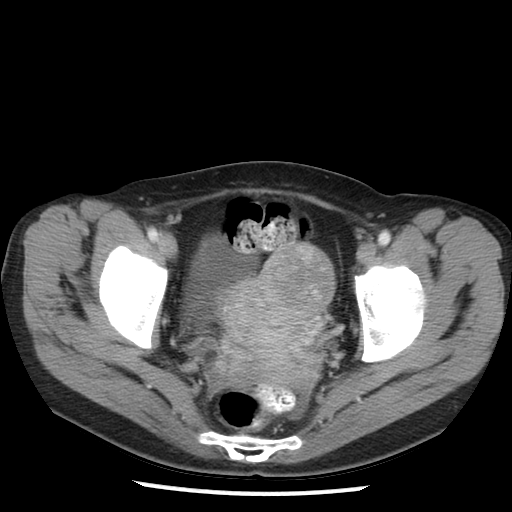
[im 28/94  soft-tissue]
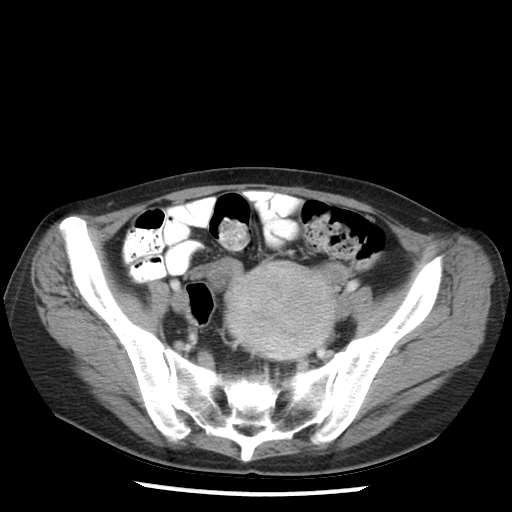
[im 38/94  soft-tissue]
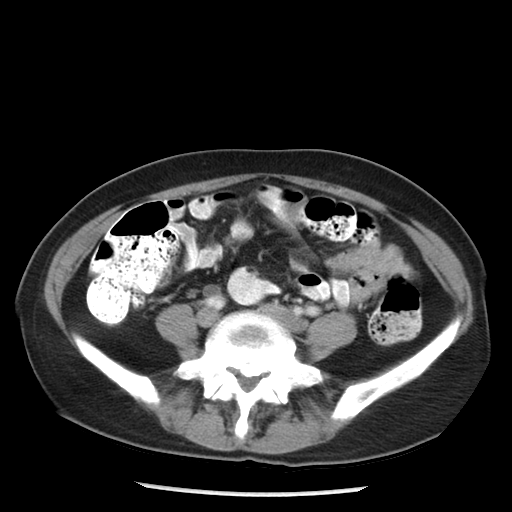
[im 47/94  soft-tissue]
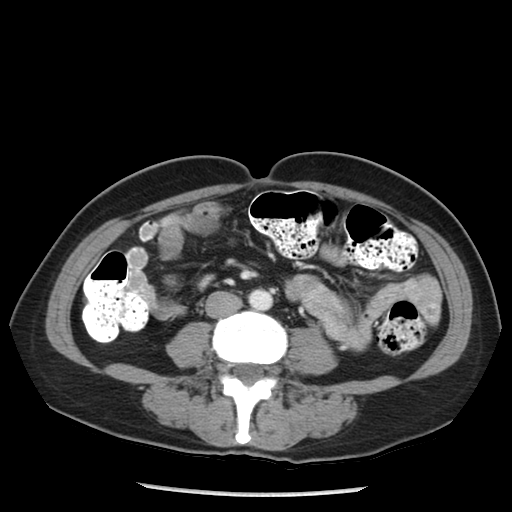
[im 56/94  soft-tissue]
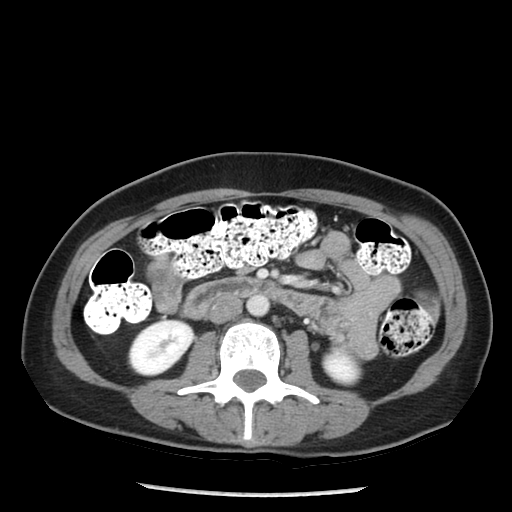
[im 56/94  lung]
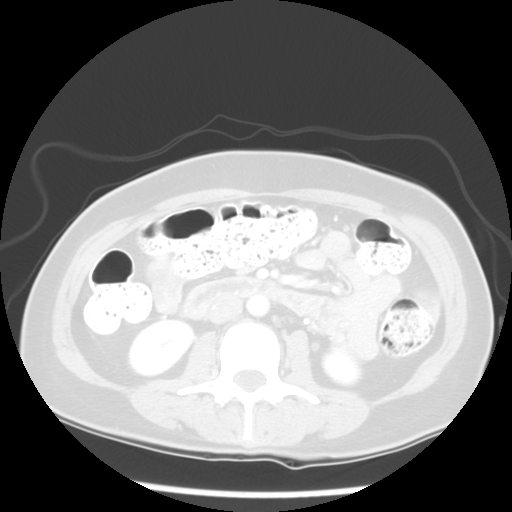
[im 66/94  soft-tissue]
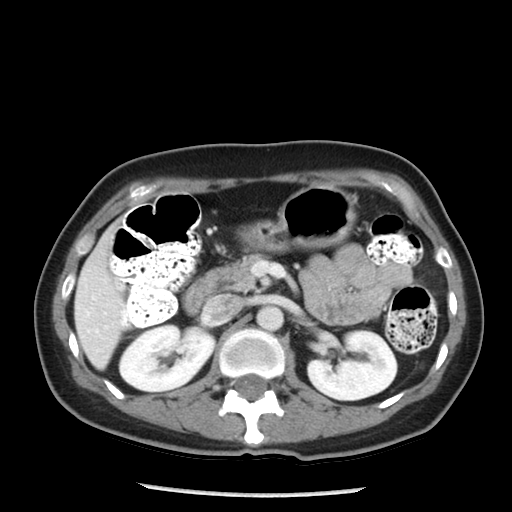
[im 66/94  lung]
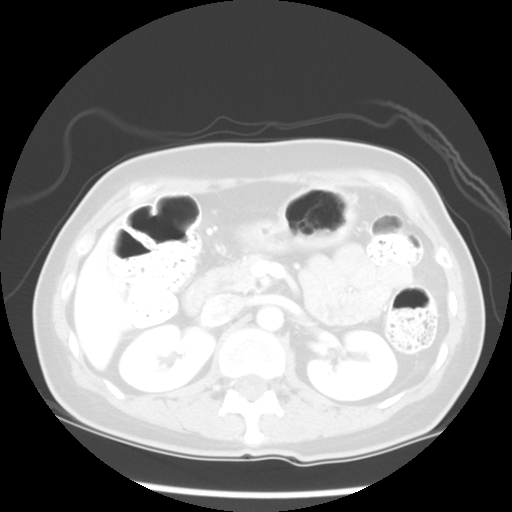
[im 75/94  soft-tissue]
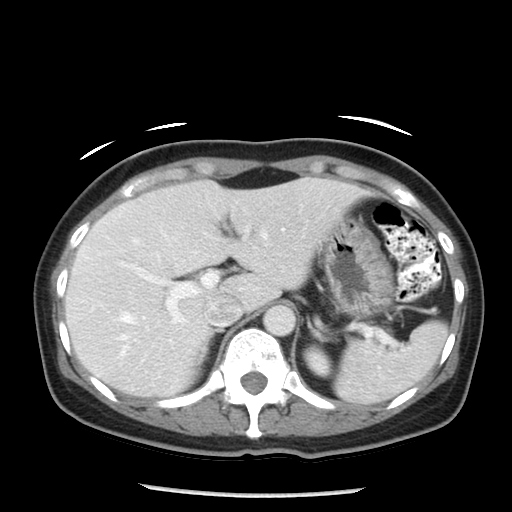
[im 75/94  lung]
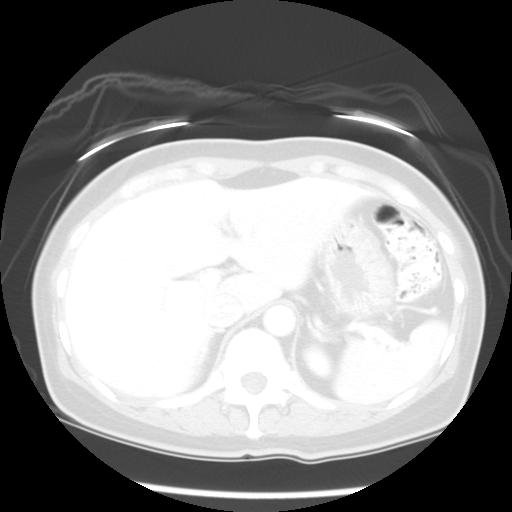
[im 84/94  soft-tissue]
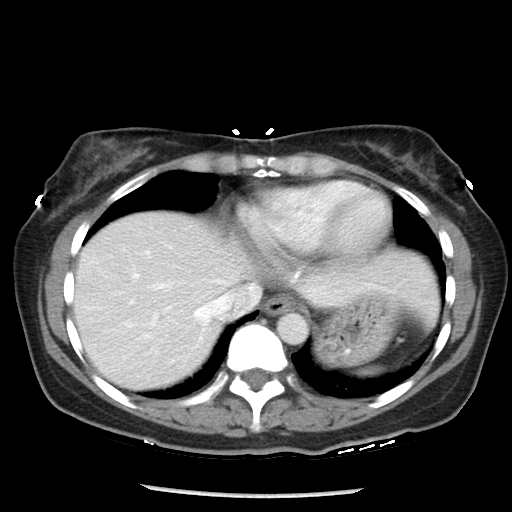
[im 84/94  lung]
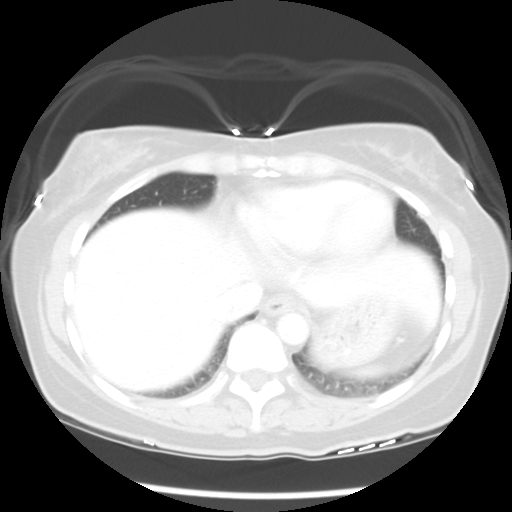
[im 84/94  bone]
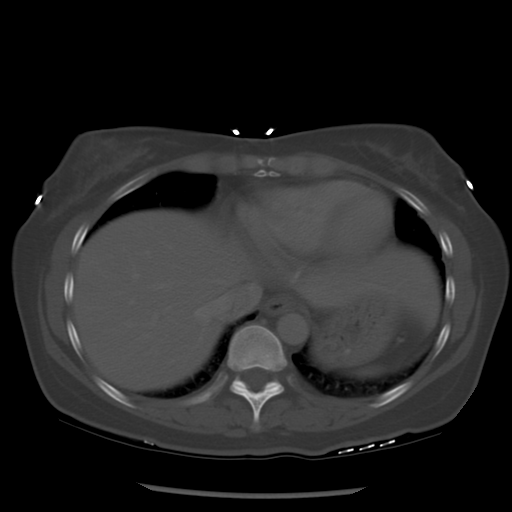

[Series 601: coronal body · coronal · 0.94mm/px · 1 of 100 slices shown, 2 images]
[im 34/100  soft-tissue]
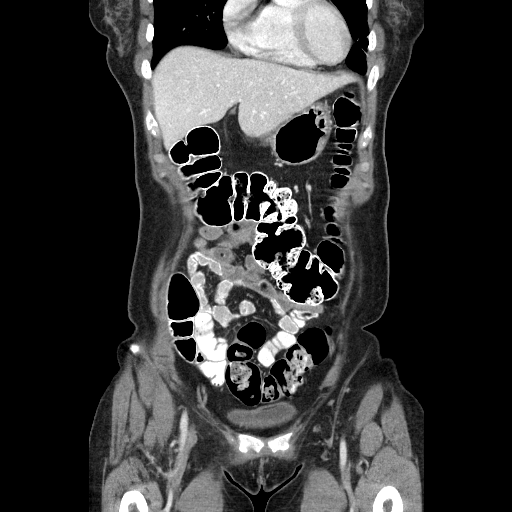
[im 34/100  bone]
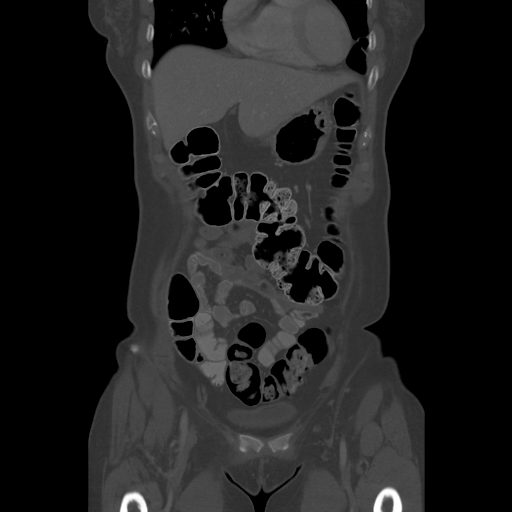

[Series 602: sagittal body · sagittal · 0.94mm/px · 2 of 129 slices shown]
[im 10/129  soft-tissue]
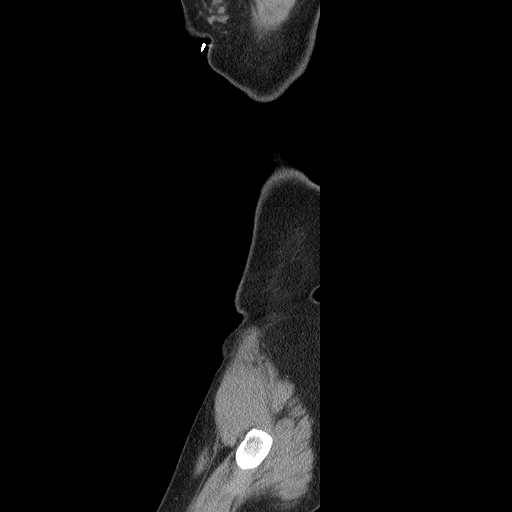
[im 28/129  soft-tissue]
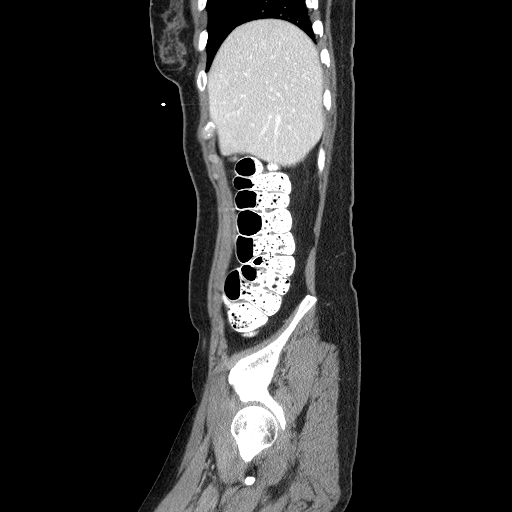

[12 of 36 positions shown; findings below may reference images not displayed]

FINDINGS: The uterus is markedly enlarged and lobular, measuring
13.5 x 7.8 x 10.2 cm in the longitudinal, transverse, and AP
dimensions.  Several focal fibroids are identified.  The largest is
at the left mid segment, measuring 5.2 cm. This displaces the left
bladder inferiorly.  There is also a 3.3 cm cyst on the left ovary
which is predominantly simple and has a small fluid fluid level
dependently, likely related to hemorrhagic products.  The right
ovary is unremarkable.

There is no free fluid or adenopathy within the abdomen or pelvis.
There is no omental or mesenteric nodularity.

The liver, spleen, splenule, pancreas, adrenal glands, gallbladder,
both kidneys., osseous structures have a normal appearance.  The
bowel is normal with no evidence of gross inflammation or
obstruction.
IMPRESSION: Fibroid uterus, as described above.

3.3 cm left ovarian cyst.  Though this is within normal limits for
premenopausal patient, please follow-up with ultrasound in 6 weeks
to document resolution because of history of elevated X2-0RK.

## 2012-04-15 LAB — HM PAP SMEAR: HM Pap smear: NEGATIVE

## 2012-06-18 ENCOUNTER — Other Ambulatory Visit: Payer: Self-pay | Admitting: Obstetrics and Gynecology

## 2012-09-15 ENCOUNTER — Ambulatory Visit (INDEPENDENT_AMBULATORY_CARE_PROVIDER_SITE_OTHER): Payer: BC Managed Care – PPO | Admitting: Family Medicine

## 2012-09-15 ENCOUNTER — Encounter: Payer: Self-pay | Admitting: Family Medicine

## 2012-09-15 VITALS — BP 118/78 | Temp 97.7°F | Wt 125.0 lb

## 2012-09-15 DIAGNOSIS — Z23 Encounter for immunization: Secondary | ICD-10-CM

## 2012-09-15 DIAGNOSIS — E785 Hyperlipidemia, unspecified: Secondary | ICD-10-CM

## 2012-09-15 LAB — LIPID PANEL: Cholesterol: 194 mg/dL (ref 0–200)

## 2012-09-15 MED ORDER — ROSUVASTATIN CALCIUM 10 MG PO TABS: 10.0000 mg | ORAL_TABLET | Freq: Every day | ORAL | Status: DC

## 2012-09-15 NOTE — Progress Notes (Signed)
Subjective:     Patient ID: Shelley Rhodes, female   DOB: 07-24-1961, 51 y.o.   MRN: [id number]  HPI Medication and lab work follow-up.  Patient is followed for OB/Gyn by Dr. Tenny Craw, where she had recent lab work in September, including comprehensive metabolic panel, lipids, and CBC.  She was noted to have very elevated total cholesterol 326 and LDL 225 on this panel.  Patient states that she had not been taking her Crestor 10mg  daily as prescribed, but instead 5mg  roughly every other day.  Since then, patient states she has been taking Crestor as prescribed, and needs a refill and would like lipids checked today.  Denies any myalgias, headache, chest pain, or SOB.  Review of Systems  Respiratory: Negative for chest tightness and shortness of breath.   Cardiovascular: Negative for chest pain.  Musculoskeletal: Negative for myalgias.  Neurological: Negative for headaches.       Objective:   Physical Exam  Constitutional: She is oriented to person, place, and time. She appears well-developed and well-nourished.  Cardiovascular: Normal rate, regular rhythm and normal heart sounds.   Pulmonary/Chest: Effort normal and breath sounds normal. No respiratory distress.  Neurological: She is alert and oriented to person, place, and time.       Assessment:     51 year old here for medical follow-up.    Plan:     1. Hyperlipidemia: recent poor control per September labs, but patient has since been taking Crestor as prescribed.  Family history of stroke and hyperlipidemia in patient's father.  Re-check lipids today, and provided written refill Rx for Crestor with instruction not to fill until labs back today or tomorrow in case dose needs to be increased. 2. Health maintenance: flu shot given today.  Placed order for mammogram.  Marthann Schiller, MS3     Agree with assessment and plan as per Marthann Schiller, MS 3 Evelena Peat MD

## 2012-09-16 NOTE — Progress Notes (Signed)
Quick Note:  Pt informed, copy mailed to her home as requested ______

## 2012-09-22 ENCOUNTER — Other Ambulatory Visit: Payer: Self-pay | Admitting: *Deleted

## 2012-09-22 MED ORDER — ROSUVASTATIN CALCIUM 10 MG PO TABS: 10.0000 mg | ORAL_TABLET | Freq: Every day | ORAL | Status: DC

## 2012-09-22 NOTE — Telephone Encounter (Signed)
Pt needs appointment then refill can be made Fax Received. Refill Completed. Janeka Libman Chowoe (R.M.A)   

## 2013-06-23 ENCOUNTER — Other Ambulatory Visit (INDEPENDENT_AMBULATORY_CARE_PROVIDER_SITE_OTHER): Payer: BC Managed Care – PPO

## 2013-06-23 DIAGNOSIS — Z0000 Encounter for general adult medical examination without abnormal findings: Secondary | ICD-10-CM

## 2013-06-23 LAB — CBC WITH DIFFERENTIAL/PLATELET
Basophils Absolute: 0 10*3/uL (ref 0.0–0.1)
Eosinophils Relative: 2.6 % (ref 0.0–5.0)
HCT: 42.4 % (ref 36.0–46.0)
Lymphs Abs: 2 10*3/uL (ref 0.7–4.0)
MCV: 86.1 fl (ref 78.0–100.0)
Monocytes Absolute: 0.3 10*3/uL (ref 0.1–1.0)
Platelets: 320 10*3/uL (ref 150.0–400.0)
RDW: 13.1 % (ref 11.5–14.6)

## 2013-06-23 LAB — BASIC METABOLIC PANEL
BUN: 17 mg/dL (ref 6–23)
Chloride: 105 mEq/L (ref 96–112)
Glucose, Bld: 115 mg/dL — ABNORMAL HIGH (ref 70–99)
Potassium: 4.3 mEq/L (ref 3.5–5.1)

## 2013-06-23 LAB — TSH: TSH: 1.69 u[IU]/mL (ref 0.35–5.50)

## 2013-06-23 LAB — HEPATIC FUNCTION PANEL
ALT: 37 U/L — ABNORMAL HIGH (ref 0–35)
Albumin: 4.4 g/dL (ref 3.5–5.2)
Total Bilirubin: 0.6 mg/dL (ref 0.3–1.2)

## 2013-06-23 LAB — LDL CHOLESTEROL, DIRECT: Direct LDL: 135.9 mg/dL

## 2013-06-23 LAB — POCT URINALYSIS DIPSTICK
Bilirubin, UA: NEGATIVE
Nitrite, UA: NEGATIVE
Protein, UA: NEGATIVE
Urobilinogen, UA: 0.2
pH, UA: 6

## 2013-06-23 LAB — LIPID PANEL: VLDL: 20.8 mg/dL (ref 0.0–40.0)

## 2013-06-29 ENCOUNTER — Ambulatory Visit (INDEPENDENT_AMBULATORY_CARE_PROVIDER_SITE_OTHER): Payer: BC Managed Care – PPO | Admitting: Family Medicine

## 2013-06-29 ENCOUNTER — Encounter: Payer: Self-pay | Admitting: Family Medicine

## 2013-06-29 VITALS — BP 118/70 | HR 62 | Temp 98.0°F | Ht 62.0 in | Wt 125.0 lb

## 2013-06-29 DIAGNOSIS — R319 Hematuria, unspecified: Secondary | ICD-10-CM

## 2013-06-29 DIAGNOSIS — Z0000 Encounter for general adult medical examination without abnormal findings: Secondary | ICD-10-CM

## 2013-06-29 LAB — POCT URINALYSIS DIPSTICK
Bilirubin, UA: NEGATIVE
Glucose, UA: NEGATIVE
Ketones, UA: NEGATIVE
Spec Grav, UA: 1.01
Urobilinogen, UA: 0.2

## 2013-06-29 MED ORDER — ROSUVASTATIN CALCIUM 10 MG PO TABS: 10.0000 mg | ORAL_TABLET | Freq: Every day | ORAL | Status: DC

## 2013-06-29 NOTE — Progress Notes (Signed)
  Subjective:    Patient ID: Shelley Rhodes, female    DOB: 08-26-1961, 52 y.o.   MRN: [id number]  HPI Patient seen for complete physical She has scheduled followup with gynecologist soon and plans to get mammogram and Pap smear through their office. She turned 51 this year. No history of previous screening colonoscopy. Has history prediabetes. She's tried cutting back rice consumption. She stays active. No symptoms of hyperglycemia.  She quit having menstrual flow about one year ago. She has intermittent hot flashes.  Hyperlipidemia treated with Crestor. Needs refills. She plans to get flu vaccine through her husband's work.  Past Medical History  Diagnosis Date  . Hyperlipidemia   . Fibroids     uterine fibroids  . Hyperlipidemia   . Right lower quadrant abdominal pain 2001  . Nausea 2001  . History of heartburn   . Appendicitis    Past Surgical History  Procedure Laterality Date  . Cesarean section      x2  . Appendectomy  appendicitis  . Finger surgery      reports that she has never smoked. She does not have any smokeless tobacco history on file. Her alcohol and drug histories are not on file. family history includes Cancer in her mother; Diabetes in her father; Hypertension in her father; Stroke in her father. No Known Allergies    Review of Systems  Constitutional: Negative for fever, activity change, appetite change, fatigue and unexpected weight change.  HENT: Negative for hearing loss, ear pain, sore throat and trouble swallowing.   Eyes: Negative for visual disturbance.  Respiratory: Negative for cough and shortness of breath.   Cardiovascular: Negative for chest pain and palpitations.  Gastrointestinal: Positive for constipation. Negative for nausea, vomiting, abdominal pain, diarrhea and blood in stool.  Endocrine: Positive for heat intolerance. Negative for polydipsia and polyuria.  Genitourinary: Negative for dysuria and hematuria.  Musculoskeletal: Negative for  myalgias, back pain and arthralgias.  Skin: Negative for rash.  Neurological: Negative for dizziness, syncope and headaches.  Hematological: Negative for adenopathy.  Psychiatric/Behavioral: Negative for confusion and dysphoric mood.       Objective:   Physical Exam  Constitutional: She is oriented to person, place, and time. She appears well-developed and well-nourished.  HENT:  Head: Normocephalic and atraumatic.  Eyes: EOM are normal. Pupils are equal, round, and reactive to light.  Neck: Normal range of motion. Neck supple. No thyromegaly present.  Cardiovascular: Normal rate, regular rhythm and normal heart sounds.   No murmur heard. Pulmonary/Chest: Breath sounds normal. No respiratory distress. She has no wheezes. She has no rales.  Abdominal: Soft. Bowel sounds are normal. She exhibits no distension and no mass. There is no tenderness. There is no rebound and no guarding.  Genitourinary:  Per gyn  Musculoskeletal: Normal range of motion. She exhibits no edema.  Lymphadenopathy:    She has no cervical adenopathy.  Neurological: She is alert and oriented to person, place, and time. She displays normal reflexes. No cranial nerve deficit.  Skin: No rash noted.  Psychiatric: She has a normal mood and affect. Her behavior is normal. Judgment and thought content normal.          Assessment & Plan:  Complete physical. Schedule screening colonoscopy. Labs reviewed with patient. She plans to get flu vaccine through her husband's work. Refill Crestor for one year.. Patient has minimally elevated liver transaminase. Repeat 3 months. She has trace blood urine dipstick. Urine micro sent

## 2013-06-29 NOTE — Patient Instructions (Addendum)

## 2013-06-30 LAB — URINALYSIS, MICROSCOPIC ONLY
Crystals: NONE SEEN
Squamous Epithelial / LPF: NONE SEEN

## 2013-07-01 ENCOUNTER — Encounter: Payer: Self-pay | Admitting: Internal Medicine

## 2013-07-23 ENCOUNTER — Other Ambulatory Visit: Payer: Self-pay | Admitting: Obstetrics and Gynecology

## 2013-08-25 ENCOUNTER — Ambulatory Visit (AMBULATORY_SURGERY_CENTER): Payer: Self-pay

## 2013-08-25 VITALS — Ht 63.0 in | Wt 117.6 lb

## 2013-08-25 DIAGNOSIS — Z1211 Encounter for screening for malignant neoplasm of colon: Secondary | ICD-10-CM

## 2013-08-25 MED ORDER — MOVIPREP 100 G PO SOLR: ORAL | Status: DC

## 2013-08-27 ENCOUNTER — Encounter: Payer: Self-pay | Admitting: Internal Medicine

## 2013-09-08 ENCOUNTER — Ambulatory Visit (AMBULATORY_SURGERY_CENTER): Payer: BC Managed Care – PPO | Admitting: Internal Medicine

## 2013-09-08 ENCOUNTER — Encounter: Payer: Self-pay | Admitting: Internal Medicine

## 2013-09-08 VITALS — BP 107/59 | HR 46 | Temp 98.9°F | Resp 16 | Ht 63.0 in | Wt 117.0 lb

## 2013-09-08 DIAGNOSIS — D126 Benign neoplasm of colon, unspecified: Secondary | ICD-10-CM

## 2013-09-08 DIAGNOSIS — Z1211 Encounter for screening for malignant neoplasm of colon: Secondary | ICD-10-CM

## 2013-09-08 MED ORDER — SODIUM CHLORIDE 0.9 % IV SOLN: 500.0000 mL | INTRAVENOUS | Status: DC

## 2013-09-08 NOTE — Progress Notes (Signed)
Called to room to assist during endoscopic procedure.  Patient ID and intended procedure confirmed with present staff. Received instructions for my participation in the procedure from the performing physician.  

## 2013-09-08 NOTE — Op Note (Signed)
[facility] [address] [place], [address]   COLONOSCOPY PROCEDURE REPORT  PATIENT: Florabel, Faulks  MR#: [id number] BIRTHDATE: Donneisha 13, 1962 , 52  yrs. old GENDER: Female ENDOSCOPIST: Roxy Cedar, MD REFERRED XB:JYNWG Burchette, M.D. PROCEDURE DATE:  09/08/2013 PROCEDURE:   Colonoscopy with snare polypectomy x 1 First Screening Colonoscopy - Avg.  risk and is 50 yrs.  old or older Yes.  Prior Negative Screening - Now for repeat screening. N/A  History of Adenoma - Now for follow-up colonoscopy & has been > or = to 3 yrs.  N/A  Polyps Removed Today? Yes. ASA CLASS:   Class II INDICATIONS:average risk screening. MEDICATIONS: MAC sedation, administered by CRNA and propofol (Diprivan) 360mg  IV  DESCRIPTION OF PROCEDURE:   After the risks benefits and alternatives of the procedure were thoroughly explained, informed consent was obtained.  A digital rectal exam revealed no abnormalities of the rectum.   The LB [id number]  endoscope was introduced through the anus and advanced to the cecum, which was identified by both the appendix and ileocecal valve. No adverse events experienced.   The quality of the prep was excellent, using MoviPrep  The instrument was then slowly withdrawn as the colon was fully examined.   COLON FINDINGS: A diminutive polyp was found in the ascending colon. A polypectomy was performed with a cold snare.  The resection was complete and the polyp tissue was completely retrieved.   The colon was otherwise normal.  There was no diverticulosis, inflammation, polyps or cancers unless previously stated.  Retroflexed views revealed internal hemorrhoids. The time to cecum=4 minutes 01 seconds.  Withdrawal time=13 minutes 59 seconds.  The scope was withdrawn and the procedure completed.  COMPLICATIONS: There were no complications.  ENDOSCOPIC IMPRESSION: 1.   Diminutive polyp was found in the ascending colon; polypectomy was performed with a  cold snare 2.   The colon was otherwise normal  RECOMMENDATIONS: 1. Repeat colonoscopy in 5 years if polyp adenomatous; otherwise 10 years   eSigned:  Roxy Cedar, MD 09/08/2013 12:13 PM   cc: Evelena Peat, MD and The Patient

## 2013-09-08 NOTE — Patient Instructions (Signed)
YOU HAD AN ENDOSCOPIC PROCEDURE TODAY AT THE [facility]: Refer to the procedure report that was given to you for any specific questions about what was found during the examination.  If the procedure report does not answer your questions, please call your gastroenterologist to clarify.  If you requested that your care partner not be given the details of your procedure findings, then the procedure report has been included in a sealed envelope for you to review at your convenience later.  YOU SHOULD EXPECT: Some feelings of bloating in the abdomen. Passage of more gas than usual.  Walking can help get rid of the air that was put into your GI tract during the procedure and reduce the bloating. If you had a lower endoscopy (such as a colonoscopy or flexible sigmoidoscopy) you may notice spotting of blood in your stool or on the toilet paper. If you underwent a bowel prep for your procedure, then you may not have a normal bowel movement for a few days.  DIET: Your first meal following the procedure should be a light meal and then it is ok to progress to your normal diet.  A half-sandwich or bowl of soup is an example of a good first meal.  Heavy or fried foods are harder to digest and may make you feel nauseous or bloated.  Likewise meals heavy in dairy and vegetables can cause extra gas to form and this can also increase the bloating.  Drink plenty of fluids but you should avoid alcoholic beverages for 24 hours.  ACTIVITY: Your care partner should take you home directly after the procedure.  You should plan to take it easy, moving slowly for the rest of the day.  You can resume normal activity the day after the procedure however you should NOT DRIVE or use heavy machinery for 24 hours (because of the sedation medicines used during the test).    SYMPTOMS TO REPORT IMMEDIATELY: A gastroenterologist can be reached at any hour.  During normal business hours, 8:30 AM to 5:00 PM Monday through Friday,  call [phone].  After hours and on weekends, please call the GI answering service at [phone] who will take a message and have the physician on call contact you.   Following lower endoscopy (colonoscopy or flexible sigmoidoscopy):  Excessive amounts of blood in the stool  Significant tenderness or worsening of abdominal pains  Swelling of the abdomen that is new, acute  Fever of 100F or higher    FOLLOW UP: If any biopsies were taken you will be contacted by phone or by letter within the next 1-3 weeks.  Call your gastroenterologist if you have not heard about the biopsies in 3 weeks.  Our staff will call the home number listed on your records the next business day following your procedure to check on you and address any questions or concerns that you may have at that time regarding the information given to you following your procedure. This is a courtesy call and so if there is no answer at the home number and we have not heard from you through the emergency physician on call, we will assume that you have returned to your regular daily activities without incident.  SIGNATURES/CONFIDENTIALITY: You and/or your care partner have signed paperwork which will be entered into your electronic medical record.  These signatures attest to the fact that that the information above on your After Visit Summary has been reviewed and is understood.  Full responsibility of the confidentiality   of this discharge information lies with you and/or your care-partner.  Polyp information given.  Dr. Marina Goodell will send you a letter to advise when to have your next colonoscopy- this will be determined after your pathology report has been reviewed by Dr. Marina Goodell.

## 2013-09-08 NOTE — Progress Notes (Signed)
Report to pacu rn, vss, bbs=clear 

## 2013-09-08 NOTE — Progress Notes (Signed)
Patient did not experience any of the following events: a burn prior to discharge; a fall within the facility; wrong site/side/patient/procedure/implant event; or a hospital transfer or hospital admission upon discharge from the facility. (G8907) Patient did not have preoperative order for IV antibiotic SSI prophylaxis. (G8918)  

## 2013-09-09 ENCOUNTER — Telehealth: Payer: Self-pay

## 2013-09-09 NOTE — Telephone Encounter (Signed)
  Follow up Call-  Call back number 09/08/2013  Post procedure Call Back phone  # [phone]  Permission to leave phone message Yes     Patient questions:  Do you have a fever, pain , or abdominal swelling? no Pain Score  0 *  Have you tolerated food without any problems? yes  Have you been able to return to your normal activities? yes  Do you have any questions about your discharge instructions: Diet   no Medications  no Follow up visit  no  Do you have questions or concerns about your Care? no  Actions: * If pain score is 4 or above: No action needed, pain <4.

## 2013-09-14 ENCOUNTER — Encounter: Payer: Self-pay | Admitting: Internal Medicine

## 2013-09-23 ENCOUNTER — Other Ambulatory Visit (INDEPENDENT_AMBULATORY_CARE_PROVIDER_SITE_OTHER): Payer: BC Managed Care – PPO

## 2013-09-23 LAB — HEPATIC FUNCTION PANEL
ALT: 22 U/L (ref 0–35)
Alkaline Phosphatase: 60 U/L (ref 39–117)
Bilirubin, Direct: 0 mg/dL (ref 0.0–0.3)
Total Bilirubin: 0.6 mg/dL (ref 0.3–1.2)

## 2013-11-11 ENCOUNTER — Encounter: Payer: Self-pay | Admitting: Cardiovascular Disease

## 2014-06-23 ENCOUNTER — Other Ambulatory Visit (INDEPENDENT_AMBULATORY_CARE_PROVIDER_SITE_OTHER): Payer: BC Managed Care – PPO

## 2014-06-23 DIAGNOSIS — Z0000 Encounter for general adult medical examination without abnormal findings: Secondary | ICD-10-CM

## 2014-06-23 LAB — LIPID PANEL
CHOL/HDL RATIO: 4
Cholesterol: 258 mg/dL — ABNORMAL HIGH (ref 0–200)
HDL: 71 mg/dL (ref >39.00)
LDL CALC: 156 mg/dL — AB (ref 0–99)
NonHDL: 187
Triglycerides: 156 mg/dL — ABNORMAL HIGH (ref 0.0–149.0)
VLDL: 31.2 mg/dL (ref 0.0–40.0)

## 2014-06-23 LAB — POCT URINALYSIS DIPSTICK
Bilirubin, UA: NEGATIVE
Glucose, UA: NEGATIVE
KETONES UA: NEGATIVE
Nitrite, UA: NEGATIVE
PH UA: 7.5
Protein, UA: NEGATIVE
Spec Grav, UA: 1.015
Urobilinogen, UA: 0.2

## 2014-06-23 LAB — HEPATIC FUNCTION PANEL
ALBUMIN: 4.5 g/dL (ref 3.5–5.2)
ALT: 26 U/L (ref 0–35)
AST: 24 U/L (ref 0–37)
Alkaline Phosphatase: 65 U/L (ref 39–117)
BILIRUBIN DIRECT: 0 mg/dL (ref 0.0–0.3)
TOTAL PROTEIN: 7.5 g/dL (ref 6.0–8.3)
Total Bilirubin: 0.9 mg/dL (ref 0.2–1.2)

## 2014-06-23 LAB — CBC WITH DIFFERENTIAL/PLATELET
Basophils Absolute: 0 10*3/uL (ref 0.0–0.1)
Basophils Relative: 0.6 % (ref 0.0–3.0)
EOS PCT: 3.2 % (ref 0.0–5.0)
Eosinophils Absolute: 0.2 10*3/uL (ref 0.0–0.7)
HCT: 44 % (ref 36.0–46.0)
HEMOGLOBIN: 14.6 g/dL (ref 12.0–15.0)
LYMPHS PCT: 39.6 % (ref 12.0–46.0)
Lymphs Abs: 2 10*3/uL (ref 0.7–4.0)
MCHC: 33.3 g/dL (ref 30.0–36.0)
MCV: 89.8 fl (ref 78.0–100.0)
MONOS PCT: 6.3 % (ref 3.0–12.0)
Monocytes Absolute: 0.3 10*3/uL (ref 0.1–1.0)
NEUTROS ABS: 2.6 10*3/uL (ref 1.4–7.7)
Neutrophils Relative %: 50.3 % (ref 43.0–77.0)
Platelets: 365 10*3/uL (ref 150.0–400.0)
RBC: 4.9 Mil/uL (ref 3.87–5.11)
RDW: 13.6 % (ref 11.5–15.5)
WBC: 5.2 10*3/uL (ref 4.0–10.5)

## 2014-06-23 LAB — BASIC METABOLIC PANEL
BUN: 14 mg/dL (ref 6–23)
CALCIUM: 9.4 mg/dL (ref 8.4–10.5)
CO2: 30 mEq/L (ref 19–32)
Chloride: 104 mEq/L (ref 96–112)
Creatinine, Ser: 0.7 mg/dL (ref 0.4–1.2)
GFR: 88.99 mL/min (ref >60.00)
Glucose, Bld: 114 mg/dL — ABNORMAL HIGH (ref 70–99)
Potassium: 4.8 mEq/L (ref 3.5–5.1)
Sodium: 140 mEq/L (ref 135–145)

## 2014-06-23 LAB — TSH: TSH: 0.97 u[IU]/mL (ref 0.35–4.50)

## 2014-06-30 ENCOUNTER — Ambulatory Visit (INDEPENDENT_AMBULATORY_CARE_PROVIDER_SITE_OTHER): Payer: BC Managed Care – PPO | Admitting: Family Medicine

## 2014-06-30 ENCOUNTER — Encounter: Payer: Self-pay | Admitting: Family Medicine

## 2014-06-30 VITALS — BP 120/70 | HR 64 | Temp 98.0°F | Ht 63.0 in | Wt 118.0 lb

## 2014-06-30 DIAGNOSIS — R319 Hematuria, unspecified: Secondary | ICD-10-CM

## 2014-06-30 DIAGNOSIS — E785 Hyperlipidemia, unspecified: Secondary | ICD-10-CM

## 2014-06-30 DIAGNOSIS — Z0000 Encounter for general adult medical examination without abnormal findings: Secondary | ICD-10-CM

## 2014-06-30 LAB — POCT URINALYSIS DIPSTICK
Bilirubin, UA: NEGATIVE
Glucose, UA: NEGATIVE
KETONES UA: NEGATIVE
LEUKOCYTES UA: NEGATIVE
Nitrite, UA: NEGATIVE
PH UA: 5.5
PROTEIN UA: NEGATIVE
Spec Grav, UA: 1.02
UROBILINOGEN UA: 0.2

## 2014-06-30 LAB — URINALYSIS, MICROSCOPIC ONLY

## 2014-06-30 MED ORDER — ROSUVASTATIN CALCIUM 10 MG PO TABS: 10.0000 mg | ORAL_TABLET | Freq: Every day | ORAL | Status: DC

## 2014-06-30 NOTE — Progress Notes (Signed)
Pre visit review using our clinic review tool, if applicable. No additional management support is needed unless otherwise documented below in the visit note. 

## 2014-06-30 NOTE — Progress Notes (Signed)
   Subjective:    Patient ID: Shelley Rhodes, female    DOB: [birth year], 53 y.o.   MRN: [id number]  HPI  Patient seen for complete physical. She has hyperlipidemia treated with Crestor 10 mg daily. Patient is very healthy generally. No other chronic medications. She does get flu vaccine at her husband's work. She sees gynecologist regularly. She's had minimally elevated glucose in the past and increased lipids. Nonsmoker. She has history of microscopic hematuria -by dipstick with negative micro.  Past Medical History  Diagnosis Date  . Hyperlipidemia   . Fibroids     uterine fibroids  . Hyperlipidemia   . Right lower quadrant abdominal pain 2001  . Nausea 2001  . History of heartburn   . Appendicitis    Past Surgical History  Procedure Laterality Date  . Cesarean section      x2  . Appendectomy  appendicitis  . Finger surgery      right 4th finger    reports that she has never smoked. She has never used smokeless tobacco. She reports that she does not drink alcohol or use illicit drugs. family history includes Cancer in her mother; Diabetes in her father; Hypertension in her father; Stroke in her father. No Known Allergies   Review of Systems  Constitutional: Negative for fever, activity change, appetite change, fatigue and unexpected weight change.  HENT: Negative for ear pain, hearing loss, sore throat and trouble swallowing.   Eyes: Negative for visual disturbance.  Respiratory: Negative for cough and shortness of breath.   Cardiovascular: Negative for chest pain and palpitations.  Gastrointestinal: Negative for abdominal pain, diarrhea, constipation and blood in stool.  Genitourinary: Negative for dysuria and hematuria.  Musculoskeletal: Negative for arthralgias, back pain and myalgias.  Skin: Negative for rash.  Neurological: Negative for dizziness, syncope and headaches.  Hematological: Negative for adenopathy.  Psychiatric/Behavioral: Negative for confusion and dysphoric  mood.       Objective:   Physical Exam  Constitutional: She is oriented to person, place, and time. She appears well-developed and well-nourished.  HENT:  Head: Normocephalic and atraumatic.  Eyes: EOM are normal. Pupils are equal, round, and reactive to light.  Neck: Normal range of motion. Neck supple. No thyromegaly present.  Cardiovascular: Normal rate, regular rhythm and normal heart sounds.   No murmur heard. Pulmonary/Chest: Breath sounds normal. No respiratory distress. She has no wheezes. She has no rales.  Abdominal: Soft. Bowel sounds are normal. She exhibits no distension and no mass. There is no tenderness. There is no rebound and no guarding.  Musculoskeletal: Normal range of motion. She exhibits no edema.  Lymphadenopathy:    She has no cervical adenopathy.  Neurological: She is alert and oriented to person, place, and time. She displays normal reflexes. No cranial nerve deficit.  Skin: No rash noted.  Psychiatric: She has a normal mood and affect. Her behavior is normal. Judgment and thought content normal.          Assessment & Plan:  Complete physical. Labs reviewed. She has mildly elevated glucose and lipids are up compared to last year. We discussed options of increasing Crestor and she wishes to try increased exercise and dietary modification and repeat in 6 months. She will get flu vaccine through her husband's work.

## 2014-06-30 NOTE — Patient Instructions (Signed)
Increase exercise and let's plan repeat lipids in 6 months.

## 2014-07-28 ENCOUNTER — Other Ambulatory Visit: Payer: Self-pay | Admitting: Obstetrics and Gynecology

## 2014-07-29 LAB — CYTOLOGY - PAP

## 2014-12-29 ENCOUNTER — Other Ambulatory Visit (INDEPENDENT_AMBULATORY_CARE_PROVIDER_SITE_OTHER): Payer: BLUE CROSS/BLUE SHIELD

## 2014-12-29 DIAGNOSIS — E785 Hyperlipidemia, unspecified: Secondary | ICD-10-CM | POA: Diagnosis not present

## 2014-12-29 DIAGNOSIS — R739 Hyperglycemia, unspecified: Secondary | ICD-10-CM

## 2014-12-29 LAB — BASIC METABOLIC PANEL
BUN: 15 mg/dL (ref 6–23)
CO2: 27 meq/L (ref 19–32)
Calcium: 9.1 mg/dL (ref 8.4–10.5)
Chloride: 103 mEq/L (ref 96–112)
Creatinine, Ser: 0.66 mg/dL (ref 0.40–1.20)
GFR: 99.77 mL/min (ref >60.00)
GLUCOSE: 101 mg/dL — AB (ref 70–99)
Potassium: 4 mEq/L (ref 3.5–5.1)
SODIUM: 137 meq/L (ref 135–145)

## 2014-12-30 LAB — LIPID PANEL
CHOLESTEROL: 215 mg/dL — AB (ref 0–200)
HDL: 69.1 mg/dL (ref >39.00)
LDL Cholesterol: 118 mg/dL — ABNORMAL HIGH (ref 0–99)
NONHDL: 145.9
Total CHOL/HDL Ratio: 3
Triglycerides: 140 mg/dL (ref 0.0–149.0)
VLDL: 28 mg/dL (ref 0.0–40.0)

## 2015-03-10 ENCOUNTER — Telehealth: Payer: Self-pay | Admitting: *Deleted

## 2015-03-10 NOTE — Telephone Encounter (Signed)
Spoke to pt had Mammogram done 06/2014. Chart updated.

## 2015-07-08 ENCOUNTER — Telehealth: Payer: Self-pay | Admitting: Family Medicine

## 2015-07-08 ENCOUNTER — Other Ambulatory Visit (INDEPENDENT_AMBULATORY_CARE_PROVIDER_SITE_OTHER): Payer: BLUE CROSS/BLUE SHIELD

## 2015-07-08 DIAGNOSIS — Z0000 Encounter for general adult medical examination without abnormal findings: Secondary | ICD-10-CM

## 2015-07-08 LAB — LIPID PANEL
CHOLESTEROL: 239 mg/dL — AB (ref 0–200)
HDL: 73.1 mg/dL (ref >39.00)
LDL CALC: 134 mg/dL — AB (ref 0–99)
NonHDL: 165.53
Total CHOL/HDL Ratio: 3
Triglycerides: 157 mg/dL — ABNORMAL HIGH (ref 0.0–149.0)
VLDL: 31.4 mg/dL (ref 0.0–40.0)

## 2015-07-08 LAB — CBC WITH DIFFERENTIAL/PLATELET
Basophils Absolute: 0 10*3/uL (ref 0.0–0.1)
Basophils Relative: 0.6 % (ref 0.0–3.0)
EOS ABS: 0.2 10*3/uL (ref 0.0–0.7)
EOS PCT: 2.7 % (ref 0.0–5.0)
HCT: 43.7 % (ref 36.0–46.0)
Hemoglobin: 14.6 g/dL (ref 12.0–15.0)
LYMPHS ABS: 2.2 10*3/uL (ref 0.7–4.0)
Lymphocytes Relative: 38.4 % (ref 12.0–46.0)
MCHC: 33.4 g/dL (ref 30.0–36.0)
MCV: 87.8 fl (ref 78.0–100.0)
MONO ABS: 0.4 10*3/uL (ref 0.1–1.0)
Monocytes Relative: 6.3 % (ref 3.0–12.0)
NEUTROS PCT: 52 % (ref 43.0–77.0)
Neutro Abs: 3 10*3/uL (ref 1.4–7.7)
Platelets: 361 10*3/uL (ref 150.0–400.0)
RBC: 4.98 Mil/uL (ref 3.87–5.11)
RDW: 14 % (ref 11.5–15.5)
WBC: 5.8 10*3/uL (ref 4.0–10.5)

## 2015-07-08 LAB — BASIC METABOLIC PANEL
BUN: 16 mg/dL (ref 6–23)
CALCIUM: 9.6 mg/dL (ref 8.4–10.5)
CO2: 30 mEq/L (ref 19–32)
CREATININE: 0.63 mg/dL (ref 0.40–1.20)
Chloride: 102 mEq/L (ref 96–112)
GFR: 105.06 mL/min (ref >60.00)
GLUCOSE: 88 mg/dL (ref 70–99)
POTASSIUM: 4.1 meq/L (ref 3.5–5.1)
Sodium: 141 mEq/L (ref 135–145)

## 2015-07-08 LAB — HEPATIC FUNCTION PANEL
ALT: 19 U/L (ref 0–35)
AST: 18 U/L (ref 0–37)
Albumin: 4.6 g/dL (ref 3.5–5.2)
Alkaline Phosphatase: 67 U/L (ref 39–117)
BILIRUBIN DIRECT: 0.1 mg/dL (ref 0.0–0.3)
BILIRUBIN TOTAL: 0.6 mg/dL (ref 0.2–1.2)
Total Protein: 7.6 g/dL (ref 6.0–8.3)

## 2015-07-08 LAB — TSH: TSH: 1.91 u[IU]/mL (ref 0.35–4.50)

## 2015-07-08 MED ORDER — ROSUVASTATIN CALCIUM 10 MG PO TABS: 10.0000 mg | ORAL_TABLET | Freq: Every day | ORAL | Status: DC

## 2015-07-08 NOTE — Telephone Encounter (Signed)
Rx was sent to the pharmacy 07/08/15 at 9:45

## 2015-07-08 NOTE — Telephone Encounter (Signed)
Patient says she is out of Crestor.  In her chart it says it was sent to [facility] on 07/01/15, however[facility] and are telling her she has to see the dr before she can get it.  Can you please check on this and then give the patient a call back?  Thank you.

## 2015-07-25 ENCOUNTER — Encounter: Payer: Self-pay | Admitting: Family Medicine

## 2015-07-25 ENCOUNTER — Ambulatory Visit (INDEPENDENT_AMBULATORY_CARE_PROVIDER_SITE_OTHER): Payer: BLUE CROSS/BLUE SHIELD | Admitting: Family Medicine

## 2015-07-25 VITALS — BP 102/72 | HR 67 | Temp 98.0°F | Ht 63.0 in | Wt 125.2 lb

## 2015-07-25 DIAGNOSIS — Z23 Encounter for immunization: Secondary | ICD-10-CM | POA: Diagnosis not present

## 2015-07-25 DIAGNOSIS — Z0000 Encounter for general adult medical examination without abnormal findings: Secondary | ICD-10-CM

## 2015-07-25 NOTE — Progress Notes (Signed)
Pre visit review using our clinic review tool, if applicable. No additional management support is needed unless otherwise documented below in the visit note. 

## 2015-07-25 NOTE — Progress Notes (Signed)
   Subjective:    Patient ID: Shelley Rhodes, female    DOB: 25-Dec-1960, 54 y.o.   MRN: [id number]  HPI Patient seen for complete physical. She sees gynecologist yearly. She is generally very healthy. She has hyperlipidemia treated with low-dose Crestor. Nonsmoker. Recent constipation issues intermittently. Usually relieved with over-the-counter medication. Walks some for exercise but inconsistently. Colonoscopy up-to-date. Mammograms obtain yearly through GYN.  Past Medical History  Diagnosis Date  . Hyperlipidemia   . Fibroids     uterine fibroids  . Hyperlipidemia   . Right lower quadrant abdominal pain 2001  . Nausea 2001  . History of heartburn   . Appendicitis    Past Surgical History  Procedure Laterality Date  . Cesarean section      x2  . Appendectomy  appendicitis  . Finger surgery      right 4th finger    reports that she has never smoked. She has never used smokeless tobacco. She reports that she does not drink alcohol or use illicit drugs. family history includes Cancer in her mother; Diabetes in her father; Hypertension in her father; Stroke in her father. No Known Allergies   Review of Systems  Constitutional: Negative for fever, activity change, appetite change, fatigue and unexpected weight change.  HENT: Negative for ear pain, hearing loss, sore throat and trouble swallowing.   Eyes: Negative for visual disturbance.  Respiratory: Negative for cough and shortness of breath.   Cardiovascular: Negative for chest pain and palpitations.  Gastrointestinal: Negative for abdominal pain, diarrhea, constipation and blood in stool.  Genitourinary: Negative for dysuria and hematuria.  Musculoskeletal: Negative for myalgias, back pain and arthralgias.  Skin: Negative for rash.  Neurological: Negative for dizziness, syncope and headaches.  Hematological: Negative for adenopathy.  Psychiatric/Behavioral: Negative for confusion and dysphoric mood.       Objective:   Physical  Exam  Constitutional: She is oriented to person, place, and time. She appears well-developed and well-nourished.  HENT:  Head: Normocephalic and atraumatic.  Eyes: EOM are normal. Pupils are equal, round, and reactive to light.  Neck: Normal range of motion. Neck supple. No thyromegaly present.  Cardiovascular: Normal rate, regular rhythm and normal heart sounds.   No murmur heard. Pulmonary/Chest: Breath sounds normal. No respiratory distress. She has no wheezes. She has no rales.  Abdominal: Soft. Bowel sounds are normal. She exhibits no distension and no mass. There is no tenderness. There is no rebound and no guarding.  Genitourinary:  Per GYN  Musculoskeletal: Normal range of motion. She exhibits no edema.  Lymphadenopathy:    She has no cervical adenopathy.  Neurological: She is alert and oriented to person, place, and time. She displays normal reflexes. No cranial nerve deficit.  Skin: No rash noted.  Psychiatric: She has a normal mood and affect. Her behavior is normal. Judgment and thought content normal.          Assessment & Plan:  Complete physical. Labs reviewed. No major concerns. Overall low risk for CAD. We discussed constipation issues. She will try over-the-counter MiraLAX as needed but first increase fluid intake and fiber intake and increase walking.

## 2015-07-25 NOTE — Patient Instructions (Signed)
Constipation, Adult Constipation is when a person has fewer than three bowel movements a week, has difficulty having a bowel movement, or has stools that are dry, hard, or larger than normal. As people grow older, constipation is more common. A low-fiber diet, not taking in enough fluids, and taking certain medicines may make constipation worse.  CAUSES   Certain medicines, such as antidepressants, pain medicine, iron supplements, antacids, and water pills.   Certain diseases, such as diabetes, irritable bowel syndrome (IBS), thyroid disease, or depression.   Not drinking enough water.   Not eating enough fiber-rich foods.   Stress or travel.   Lack of physical activity or exercise.   Ignoring the urge to have a bowel movement.   Using laxatives too much.  SIGNS AND SYMPTOMS   Having fewer than three bowel movements a week.   Straining to have a bowel movement.   Having stools that are hard, dry, or larger than normal.   Feeling full or bloated.   Pain in the lower abdomen.   Not feeling relief after having a bowel movement.  DIAGNOSIS  Your health care provider will take a medical history and perform a physical exam. Further testing may be done for severe constipation. Some tests may include:  A barium enema X-ray to examine your rectum, colon, and, sometimes, your small intestine.   A sigmoidoscopy to examine your lower colon.   A colonoscopy to examine your entire colon. TREATMENT  Treatment will depend on the severity of your constipation and what is causing it. Some dietary treatments include drinking more fluids and eating more fiber-rich foods. Lifestyle treatments may include regular exercise. If these diet and lifestyle recommendations do not help, your health care provider may recommend taking over-the-counter laxative medicines to help you have bowel movements. Prescription medicines may be prescribed if over-the-counter medicines do not work.   HOME CARE INSTRUCTIONS   Eat foods that have a lot of fiber, such as fruits, vegetables, whole grains, and beans.  Limit foods high in fat and processed sugars, such as french fries, hamburgers, cookies, candies, and soda.   A fiber supplement may be added to your diet if you cannot get enough fiber from foods.   Drink enough fluids to keep your urine clear or pale yellow.   Exercise regularly or as directed by your health care provider.   Go to the restroom when you have the urge to go. Do not hold it.   Only take over-the-counter or prescription medicines as directed by your health care provider. Do not take other medicines for constipation without talking to your health care provider first.  [place] IF:   You have bright red blood in your stool.   Your constipation lasts for more than 4 days or gets worse.   You have abdominal or rectal pain.   You have thin, pencil-like stools.   You have unexplained weight loss. MAKE SURE YOU:   Understand these instructions.  Will watch your condition.  Will get help right away if you are not doing well or get worse.   This information is not intended to replace advice given to you by your health care provider. Make sure you discuss any questions you have with your health care provider.   Document Released: 06/22/2004 Document Revised: 10/15/2014 Document Reviewed: 07/06/2013 Elsevier Interactive Patient Education [employer].  For severe constipation consider short term use of Miralax.

## 2016-01-16 ENCOUNTER — Encounter: Payer: Self-pay | Admitting: Family Medicine

## 2016-01-16 ENCOUNTER — Ambulatory Visit (INDEPENDENT_AMBULATORY_CARE_PROVIDER_SITE_OTHER): Payer: BLUE CROSS/BLUE SHIELD | Admitting: Family Medicine

## 2016-01-16 VITALS — BP 122/82 | HR 60 | Temp 98.6°F

## 2016-01-16 DIAGNOSIS — M7711 Lateral epicondylitis, right elbow: Secondary | ICD-10-CM | POA: Diagnosis not present

## 2016-01-16 DIAGNOSIS — Z0289 Encounter for other administrative examinations: Secondary | ICD-10-CM

## 2016-01-16 MED ORDER — DICLOFENAC SODIUM 75 MG PO TBEC: 75.0000 mg | DELAYED_RELEASE_TABLET | Freq: Two times a day (BID) | ORAL | Status: DC

## 2016-01-16 NOTE — Patient Instructions (Signed)
Lateral Epicondylitis With Rehab Lateral epicondylitis involves inflammation and pain around the outer portion of the elbow. The pain is caused by inflammation of the tendons in the forearm that bring back (extend) the wrist. Lateral epicondylitis is also called tennis elbow, because it is very common in tennis players. However, it may occur in any individual who extends the wrist repetitively. If lateral epicondylitis is left untreated, it may become a chronic problem. SYMPTOMS   Pain, tenderness, and inflammation on the outer (lateral) side of the elbow.  Pain or weakness with gripping activities.  Pain that increases with wrist-twisting motions (playing tennis, using a screwdriver, opening a door or a jar).  Pain with lifting objects, including a coffee cup. CAUSES  Lateral epicondylitis is caused by inflammation of the tendons that extend the wrist. Causes of injury may include:  Repetitive stress and strain on the muscles and tendons that extend the wrist.  Sudden change in activity level or intensity.  Incorrect grip in racquet sports.  Incorrect grip size of racquet (often too large).  Incorrect hitting position or technique (usually backhand, leading with the elbow).  Using a racket that is too heavy. RISK INCREASES WITH:  Sports or occupations that require repetitive and/or strenuous forearm and wrist movements (tennis, squash, racquetball, carpentry).  Poor wrist and forearm strength and flexibility.  Failure to warm up properly before activity.  Resuming activity before healing, rehabilitation, and conditioning are complete. PREVENTION   Warm up and stretch properly before activity.  Maintain physical fitness:  Strength, flexibility, and endurance.  Cardiovascular fitness.  Wear and use properly fitted equipment.  Learn and use proper technique and have a coach correct improper technique.  Wear a tennis elbow (counterforce) brace. PROGNOSIS  The course of  this condition depends on the degree of the injury. If treated properly, acute cases (symptoms lasting less than 4 weeks) are often resolved in 2 to 6 weeks. Chronic (longer lasting cases) often resolve in 3 to 6 months but may require physical therapy. RELATED COMPLICATIONS   Frequently recurring symptoms, resulting in a chronic problem. Properly treating the problem the first time decreases frequency of recurrence.  Chronic inflammation, scarring tendon degeneration, and partial tendon tear, requiring surgery.  Delayed healing or resolution of symptoms. TREATMENT  Treatment first involves the use of ice and medicine to reduce pain and inflammation. Strengthening and stretching exercises may help reduce discomfort if performed regularly. These exercises may be performed at home if the condition is an acute injury. Chronic cases may require a referral to a physical therapist for evaluation and treatment. Your caregiver may advise a corticosteroid injection to help reduce inflammation. Rarely, surgery is needed. MEDICATION  If pain medicine is needed, nonsteroidal anti-inflammatory medicines (aspirin and ibuprofen), or other minor pain relievers (acetaminophen), are often advised.  Do not take pain medicine for 7 days before surgery.  Prescription pain relievers may be given, if your caregiver thinks they are needed. Use only as directed and only as much as you need.  Corticosteroid injections may be recommended. These injections should be reserved only for the most severe cases, because they can only be given a certain number of times. HEAT AND COLD  Cold treatment (icing) should be applied for 10 to 15 minutes every 2 to 3 hours for inflammation and pain, and immediately after activity that aggravates your symptoms. Use ice packs or an ice massage.  Heat treatment may be used before performing stretching and strengthening activities prescribed by your caregiver, physical therapist,   or  athletic trainer. Use a heat pack or a warm water soak. SEEK MEDICAL CARE IF: Symptoms get worse or do not improve in 2 weeks, despite treatment. EXERCISES  RANGE OF MOTION (ROM) AND STRETCHING EXERCISES - Epicondylitis, Lateral (Tennis Elbow) These exercises may help you when beginning to rehabilitate your injury. Your symptoms may go away with or without further involvement from your physician, physical therapist, or athletic trainer. While completing these exercises, remember:   Restoring tissue flexibility helps normal motion to return to the joints. This allows healthier, less painful movement and activity.  An effective stretch should be held for at least 30 seconds.  A stretch should never be painful. You should only feel a gentle lengthening or release in the stretched tissue. RANGE OF MOTION - Wrist Flexion, Active-Assisted  Extend your right / left elbow with your fingers pointing down.*  Gently pull the back of your hand towards you, until you feel a gentle stretch on the top of your forearm.  Hold this position for __________ seconds. Repeat __________ times. Complete this exercise __________ times per day.  *If directed by your physician, physical therapist or athletic trainer, complete this stretch with your elbow bent, rather than extended. RANGE OF MOTION - Wrist Extension, Active-Assisted  Extend your right / left elbow and turn your palm upwards.*  Gently pull your palm and fingertips back, so your wrist extends and your fingers point more toward the ground.  You should feel a gentle stretch on the inside of your forearm.  Hold this position for __________ seconds. Repeat __________ times. Complete this exercise __________ times per day. *If directed by your physician, physical therapist or athletic trainer, complete this stretch with your elbow bent, rather than extended. STRETCH - Wrist Flexion  Place the back of your right / left hand on a tabletop, leaving your  elbow slightly bent. Your fingers should point away from your body.  Gently press the back of your hand down onto the table by straightening your elbow. You should feel a stretch on the top of your forearm.  Hold this position for __________ seconds. Repeat __________ times. Complete this stretch __________ times per day.  STRETCH - Wrist Extension   Place your right / left fingertips on a tabletop, leaving your elbow slightly bent. Your fingers should point backwards.  Gently press your fingers and palm down onto the table by straightening your elbow. You should feel a stretch on the inside of your forearm.  Hold this position for __________ seconds. Repeat __________ times. Complete this stretch __________ times per day.  STRENGTHENING EXERCISES - Epicondylitis, Lateral (Tennis Elbow) These exercises may help you when beginning to rehabilitate your injury. They may resolve your symptoms with or without further involvement from your physician, physical therapist, or athletic trainer. While completing these exercises, remember:   Muscles can gain both the endurance and the strength needed for everyday activities through controlled exercises.  Complete these exercises as instructed by your physician, physical therapist or athletic trainer. Increase the resistance and repetitions only as guided.  You may experience muscle soreness or fatigue, but the pain or discomfort you are trying to eliminate should never worsen during these exercises. If this pain does get worse, stop and make sure you are following the directions exactly. If the pain is still present after adjustments, discontinue the exercise until you can discuss the trouble with your caregiver. STRENGTH - Wrist Flexors  Sit with your right / left forearm palm-up and fully supported   on a table or countertop. Your elbow should be resting below the height of your shoulder. Allow your wrist to extend over the edge of the  surface.  Loosely holding a __________ weight, or a piece of rubber exercise band or tubing, slowly curl your hand up toward your forearm.  Hold this position for __________ seconds. Slowly lower the wrist back to the starting position in a controlled manner. Repeat __________ times. Complete this exercise __________ times per day.  STRENGTH - Wrist Extensors  Sit with your right / left forearm palm-down and fully supported on a table or countertop. Your elbow should be resting below the height of your shoulder. Allow your wrist to extend over the edge of the surface.  Loosely holding a __________ weight, or a piece of rubber exercise band or tubing, slowly curl your hand up toward your forearm.  Hold this position for __________ seconds. Slowly lower the wrist back to the starting position in a controlled manner. Repeat __________ times. Complete this exercise __________ times per day.  STRENGTH - Ulnar Deviators  Stand with a ____________________ weight in your right / left hand, or sit while holding a rubber exercise band or tubing, with your healthy arm supported on a table or countertop.  Move your wrist, so that your pinkie travels toward your forearm and your thumb moves away from your forearm.  Hold this position for __________ seconds and then slowly lower the wrist back to the starting position. Repeat __________ times. Complete this exercise __________ times per day STRENGTH - Radial Deviators  Stand with a ____________________ weight in your right / left hand, or sit while holding a rubber exercise band or tubing, with your injured arm supported on a table or countertop.  Raise your hand upward in front of you or pull up on the rubber tubing.  Hold this position for __________ seconds and then slowly lower the wrist back to the starting position. Repeat __________ times. Complete this exercise __________ times per day. STRENGTH - Forearm Supinators   Sit with your right /  left forearm supported on a table, keeping your elbow below shoulder height. Rest your hand over the edge, palm down.  Gently grip a hammer or a soup ladle.  Without moving your elbow, slowly turn your palm and hand upward to a "thumbs-up" position.  Hold this position for __________ seconds. Slowly return to the starting position. Repeat __________ times. Complete this exercise __________ times per day.  STRENGTH - Forearm Pronators   Sit with your right / left forearm supported on a table, keeping your elbow below shoulder height. Rest your hand over the edge, palm up.  Gently grip a hammer or a soup ladle.  Without moving your elbow, slowly turn your palm and hand upward to a "thumbs-up" position.  Hold this position for __________ seconds. Slowly return to the starting position. Repeat __________ times. Complete this exercise __________ times per day.  STRENGTH - Grip  Grasp a tennis ball, a dense sponge, or a large, rolled sock in your hand.  Squeeze as hard as you can, without increasing any pain.  Hold this position for __________ seconds. Release your grip slowly. Repeat __________ times. Complete this exercise __________ times per day.  STRENGTH - Elbow Extensors, Isometric  Stand or sit upright, on a firm surface. Place your right / left arm so that your palm faces your stomach, and it is at the height of your waist.  Place your opposite hand on the underside   of your forearm. Gently push up as your right / left arm resists. Push as hard as you can with both arms, without causing any pain or movement at your right / left elbow. Hold this stationary position for __________ seconds. Gradually release the tension in both arms. Allow your muscles to relax completely before repeating.   This information is not intended to replace advice given to you by your health care provider. Make sure you discuss any questions you have with your health care provider.   Document Released:  09/24/2005 Document Revised: 10/15/2014 Document Reviewed: 01/06/2009 Elsevier Interactive Patient Education 2016 [employer]  

## 2016-01-16 NOTE — Progress Notes (Signed)
Subjective:    Patient ID: Shelley Rhodes, female    DOB: 1961-10-06, 55 y.o.   MRN: [id number]  HPI  Shelley Rhodes is a 55 year old female who walked in clinic seeking evaluation for hand weakness and elbow pain for 3 days. I am seeing her in place of her PCP who is unavailable. Three days ago patient noted doing yard work moving mulch. She shoveled mulch into a wheelbarrow and then shoveled mulch again to place under trees. At the end of the day after this work, she noted that her hand felt "weak" noting that she had difficulty using chopsticks to eat. She also noted a difference in her hands when washing her face specifically with gripping, however she is able to complete these tasks. She denies numbness, tingling, and pain in this area. No treatments at home.  Associated symptom of pain noted in right elbow rated as three and exacerbated with pressure. She states it is sore but can be sharp at times. No treatments have been tried at home.    Review of Systems  Constitutional: Negative for fever, chills and fatigue.  Respiratory: Negative for cough, shortness of breath and wheezing.   Cardiovascular: Negative for chest pain, palpitations and leg swelling.  Musculoskeletal: Negative for myalgias, joint swelling and arthralgias.       Right elbow pain  Skin: Negative for rash.  Neurological: Positive for weakness. Negative for dizziness, light-headedness, numbness and headaches.       States weakness with grip  Psychiatric/Behavioral:       Appears anxious due to concern of weakness in her right hand   Past Medical History  Diagnosis Date  . Hyperlipidemia   . Fibroids     uterine fibroids  . Hyperlipidemia   . Right lower quadrant abdominal pain 2001  . Nausea 2001  . History of heartburn   . Appendicitis     Social History   Social History  . Marital Status: Married    Spouse Name: N/A  . Number of Children: N/A  . Years of Education: N/A   Occupational History  . Not on file.    Social History Main Topics  . Smoking status: Never Smoker   . Smokeless tobacco: Never Used  . Alcohol Use: No  . Drug Use: No  . Sexual Activity: Not on file   Other Topics Concern  . Not on file   Social History Narrative    Past Surgical History  Procedure Laterality Date  . Cesarean section      x2  . Appendectomy  appendicitis  . Finger surgery      right 4th finger    Family History  Problem Relation Age of Onset  . Cancer Mother     Lung  . Stroke Father   . Hypertension Father   . Diabetes Father     No Known Allergies  Current Outpatient Prescriptions on File Prior to Visit  Medication Sig Dispense Refill  . Cholecalciferol (VITAMIN D-3) 1000 UNITS CAPS Take by mouth daily.    . Glucosamine-Chondroitin-MSM (TRIPLE FLEX PO) Take by mouth. Take 2 tabs daily    . rosuvastatin (CRESTOR) 10 MG tablet Take 1 tablet (10 mg total) by mouth at bedtime. 90 tablet 3   No current facility-administered medications on file prior to visit.    BP 122/82 mmHg  Pulse 60  Temp(Src) 98.6 F (37 C) (Oral)  SpO2 98%      Objective:   Physical Exam  Constitutional: She is oriented to person, place, and time. She appears well-developed and well-nourished.  Cardiovascular: Normal rate, regular rhythm and normal heart sounds.   Pulmonary/Chest: Effort normal and breath sounds normal.  Musculoskeletal:       Right hand: Normal sensation noted. Normal strength noted.  Localized tenderness over lateral epicondyle with pain with resisted wrist extension and elbow in full extension.  Neurological: She is alert and oriented to person, place, and time. She has normal strength. No cranial nerve deficit or sensory deficit. She exhibits normal muscle tone. She displays a negative Romberg sign.  Reflex Scores:      Tricep reflexes are 2+ on the right side and 2+ on the left side.      Bicep reflexes are 2+ on the right side and 2+ on the left side.      Brachioradialis reflexes  are 2+ on the right side and 2+ on the left side. Skin: Skin is warm and dry. No rash noted.  Psychiatric:  Appears anxious stating she is concerned that she is having a stroke with her symptoms today.       Assessment & Plan:  1. Lateral epicondylitis, right Advised patient to use ice for 15 minutes on and 15 minutes off every 2-3 hours for inflammation and pain. Medication with food as prescribed.  Heat can be used before strengthening exercises. Strengthening and stretching exercises provided with written information prior to excessive exercise. Advised patient to take medication with food and RTC if symptoms persist, worsen, or new symptoms appear for further evaluation. - diclofenac (VOLTAREN) 75 MG EC tablet; Take 1 tablet (75 mg total) by mouth 2 (two) times daily.  Dispense: 30 tablet; Refill: 0

## 2016-01-24 ENCOUNTER — Ambulatory Visit (INDEPENDENT_AMBULATORY_CARE_PROVIDER_SITE_OTHER): Payer: BLUE CROSS/BLUE SHIELD | Admitting: Family Medicine

## 2016-01-24 VITALS — BP 130/90 | HR 71 | Temp 98.3°F | Ht 63.0 in | Wt 126.0 lb

## 2016-01-24 DIAGNOSIS — E785 Hyperlipidemia, unspecified: Secondary | ICD-10-CM | POA: Diagnosis not present

## 2016-01-24 LAB — LIPID PANEL
CHOL/HDL RATIO: 5
Cholesterol: 300 mg/dL — ABNORMAL HIGH (ref 0–200)
HDL: 63.5 mg/dL (ref >39.00)
LDL CALC: 208 mg/dL — AB (ref 0–99)
NonHDL: 236.75
TRIGLYCERIDES: 144 mg/dL (ref 0.0–149.0)
VLDL: 28.8 mg/dL (ref 0.0–40.0)

## 2016-01-24 NOTE — Progress Notes (Signed)
   Subjective:    Patient ID: Shelley Rhodes, female    DOB: 1960/11/09, 55 y.o.   MRN: [id number]  HPI Patient here to discuss dyslipidemia. Long history of high cholesterol but good HDL. Overall low risk for CAD. No family history of premature CAD. Nonsmoker. No history of hypertension or diabetes. Does not exercise regularly.  For years she has taken Crestor but had developed more frequent leg cramps and discontinued this last January. She had recent screening of cholesterol through her employer with total cholesterol 271, HDL 63, triglyceride 149, LDL 179.  Past Medical History  Diagnosis Date  . Hyperlipidemia   . Fibroids     uterine fibroids  . Hyperlipidemia   . Right lower quadrant abdominal pain 2001  . Nausea 2001  . History of heartburn   . Appendicitis    Past Surgical History  Procedure Laterality Date  . Cesarean section      x2  . Appendectomy  appendicitis  . Finger surgery      right 4th finger    reports that she has never smoked. She has never used smokeless tobacco. She reports that she does not drink alcohol or use illicit drugs. family history includes Cancer in her mother; Diabetes in her father; Hypertension in her father; Stroke in her father. No Known Allergies    Review of Systems  Constitutional: Negative for fatigue.  Eyes: Negative for visual disturbance.  Respiratory: Negative for cough, chest tightness, shortness of breath and wheezing.   Cardiovascular: Negative for chest pain, palpitations and leg swelling.  Neurological: Negative for dizziness, seizures, syncope, weakness, light-headedness and headaches.       Objective:   Physical Exam  Constitutional: She appears well-developed and well-nourished.  Eyes: Pupils are equal, round, and reactive to light.  Neck: Neck supple. No JVD present. No thyromegaly present.  Cardiovascular: Normal rate and regular rhythm.  Exam reveals no gallop.   Pulmonary/Chest: Effort normal and breath sounds  normal. No respiratory distress. She has no wheezes. She has no rales.  Musculoskeletal: She exhibits no edema.  Neurological: She is alert.          Assessment & Plan:  Dyslipidemia. 10 year risk of CAD event only 1.5%. Patient request repeat lipid panel fasting this morning. We've encouraged her to consider leaving off any statin at this time. She is encouraged to engage in more frequent exercise. Even after reviewing numbers above patient states that if her cholesterol is not much improved she would rather try pravastatin or lovastatin (vs no statin). Will discuss after her lipids are back.   We discussed dietary factors and reducing saturated fats though her diet appears to be fairly good currently

## 2016-01-24 NOTE — Progress Notes (Signed)
Pre visit review using our clinic review tool, if applicable. No additional management support is needed unless otherwise documented below in the visit note. 

## 2016-01-25 ENCOUNTER — Other Ambulatory Visit: Payer: Self-pay | Admitting: Family Medicine

## 2016-01-25 DIAGNOSIS — E785 Hyperlipidemia, unspecified: Secondary | ICD-10-CM

## 2016-01-25 MED ORDER — PRAVASTATIN SODIUM 40 MG PO TABS: 40.0000 mg | ORAL_TABLET | Freq: Every day | ORAL | Status: DC

## 2016-01-25 NOTE — Addendum Note (Signed)
Addended by: Eulas Post on: 01/25/2016 12:38 PM   Modules accepted: Orders

## 2016-03-26 ENCOUNTER — Other Ambulatory Visit (INDEPENDENT_AMBULATORY_CARE_PROVIDER_SITE_OTHER): Payer: BLUE CROSS/BLUE SHIELD

## 2016-03-26 DIAGNOSIS — E785 Hyperlipidemia, unspecified: Secondary | ICD-10-CM

## 2016-03-26 LAB — HEPATIC FUNCTION PANEL
ALK PHOS: 68 U/L (ref 39–117)
ALT: 18 U/L (ref 0–35)
AST: 16 U/L (ref 0–37)
Albumin: 4.4 g/dL (ref 3.5–5.2)
BILIRUBIN DIRECT: 0.1 mg/dL (ref 0.0–0.3)
BILIRUBIN TOTAL: 0.6 mg/dL (ref 0.2–1.2)
Total Protein: 7 g/dL (ref 6.0–8.3)

## 2016-04-25 ENCOUNTER — Telehealth: Payer: Self-pay | Admitting: Family Medicine

## 2016-04-25 NOTE — Telephone Encounter (Signed)
Pt would like to have results from labs 6/19 and should she do the lipid labs.  Pt state she only has two dosage left.

## 2016-04-25 NOTE — Telephone Encounter (Signed)
Pt is aware that her hepatic panel was normal. She would like to have her lipids rechecked. I scheduled her for tomorrow to recheck. It has been 3 months, is this okay?

## 2016-04-25 NOTE — Telephone Encounter (Signed)
yes

## 2016-04-26 ENCOUNTER — Other Ambulatory Visit (INDEPENDENT_AMBULATORY_CARE_PROVIDER_SITE_OTHER): Payer: BLUE CROSS/BLUE SHIELD

## 2016-04-26 DIAGNOSIS — E785 Hyperlipidemia, unspecified: Secondary | ICD-10-CM | POA: Diagnosis not present

## 2016-04-26 LAB — LIPID PANEL
CHOL/HDL RATIO: 4
Cholesterol: 248 mg/dL — ABNORMAL HIGH (ref 0–200)
HDL: 68.5 mg/dL (ref >39.00)
LDL Cholesterol: 157 mg/dL — ABNORMAL HIGH (ref 0–99)
NONHDL: 179.48
Triglycerides: 114 mg/dL (ref 0.0–149.0)
VLDL: 22.8 mg/dL (ref 0.0–40.0)

## 2016-07-08 LAB — HM MAMMOGRAPHY: HM Mammogram: NORMAL (ref 0–4)

## 2016-07-08 LAB — HM PAP SMEAR: HM PAP: NORMAL

## 2016-07-26 ENCOUNTER — Other Ambulatory Visit (INDEPENDENT_AMBULATORY_CARE_PROVIDER_SITE_OTHER): Payer: BLUE CROSS/BLUE SHIELD

## 2016-07-26 DIAGNOSIS — Z0000 Encounter for general adult medical examination without abnormal findings: Secondary | ICD-10-CM | POA: Diagnosis not present

## 2016-07-26 LAB — LIPID PANEL
CHOLESTEROL: 263 mg/dL — AB (ref 0–200)
HDL: 63.8 mg/dL (ref >39.00)
LDL Cholesterol: 171 mg/dL — ABNORMAL HIGH (ref 0–99)
NonHDL: 199.23
Total CHOL/HDL Ratio: 4
Triglycerides: 142 mg/dL (ref 0.0–149.0)
VLDL: 28.4 mg/dL (ref 0.0–40.0)

## 2016-07-26 LAB — HEPATIC FUNCTION PANEL
ALBUMIN: 4.5 g/dL (ref 3.5–5.2)
ALT: 19 U/L (ref 0–35)
AST: 19 U/L (ref 0–37)
Alkaline Phosphatase: 72 U/L (ref 39–117)
Bilirubin, Direct: 0.1 mg/dL (ref 0.0–0.3)
TOTAL PROTEIN: 7.1 g/dL (ref 6.0–8.3)
Total Bilirubin: 0.5 mg/dL (ref 0.2–1.2)

## 2016-07-26 LAB — CBC WITH DIFFERENTIAL/PLATELET
BASOS ABS: 0 10*3/uL (ref 0.0–0.1)
BASOS PCT: 0.4 % (ref 0.0–3.0)
EOS PCT: 3.2 % (ref 0.0–5.0)
Eosinophils Absolute: 0.2 10*3/uL (ref 0.0–0.7)
HEMATOCRIT: 43.5 % (ref 36.0–46.0)
Hemoglobin: 14.6 g/dL (ref 12.0–15.0)
LYMPHS ABS: 2.1 10*3/uL (ref 0.7–4.0)
LYMPHS PCT: 35.2 % (ref 12.0–46.0)
MCHC: 33.6 g/dL (ref 30.0–36.0)
MCV: 86.9 fl (ref 78.0–100.0)
MONOS PCT: 6.3 % (ref 3.0–12.0)
Monocytes Absolute: 0.4 10*3/uL (ref 0.1–1.0)
NEUTROS ABS: 3.3 10*3/uL (ref 1.4–7.7)
NEUTROS PCT: 54.9 % (ref 43.0–77.0)
PLATELETS: 425 10*3/uL — AB (ref 150.0–400.0)
RBC: 5 Mil/uL (ref 3.87–5.11)
RDW: 13.6 % (ref 11.5–15.5)
WBC: 5.9 10*3/uL (ref 4.0–10.5)

## 2016-07-26 LAB — BASIC METABOLIC PANEL
BUN: 17 mg/dL (ref 6–23)
CALCIUM: 9.3 mg/dL (ref 8.4–10.5)
CO2: 27 mEq/L (ref 19–32)
Chloride: 105 mEq/L (ref 96–112)
Creatinine, Ser: 0.68 mg/dL (ref 0.40–1.20)
GFR: 95.82 mL/min (ref >60.00)
GLUCOSE: 101 mg/dL — AB (ref 70–99)
Potassium: 4.2 mEq/L (ref 3.5–5.1)
Sodium: 142 mEq/L (ref 135–145)

## 2016-07-26 LAB — TSH: TSH: 2.66 u[IU]/mL (ref 0.35–4.50)

## 2016-08-06 ENCOUNTER — Ambulatory Visit (INDEPENDENT_AMBULATORY_CARE_PROVIDER_SITE_OTHER): Payer: BLUE CROSS/BLUE SHIELD | Admitting: Family Medicine

## 2016-08-06 ENCOUNTER — Encounter: Payer: Self-pay | Admitting: Family Medicine

## 2016-08-06 VITALS — BP 104/80 | HR 69 | Temp 98.2°F | Ht 63.0 in | Wt 127.6 lb

## 2016-08-06 DIAGNOSIS — D473 Essential (hemorrhagic) thrombocythemia: Secondary | ICD-10-CM

## 2016-08-06 DIAGNOSIS — Z0000 Encounter for general adult medical examination without abnormal findings: Secondary | ICD-10-CM | POA: Diagnosis not present

## 2016-08-06 DIAGNOSIS — Z23 Encounter for immunization: Secondary | ICD-10-CM

## 2016-08-06 DIAGNOSIS — Z1159 Encounter for screening for other viral diseases: Secondary | ICD-10-CM

## 2016-08-06 DIAGNOSIS — E785 Hyperlipidemia, unspecified: Secondary | ICD-10-CM

## 2016-08-06 DIAGNOSIS — D75839 Thrombocytosis, unspecified: Secondary | ICD-10-CM

## 2016-08-06 MED ORDER — ATORVASTATIN CALCIUM 20 MG PO TABS: 20.0000 mg | ORAL_TABLET | Freq: Every day | ORAL | 3 refills | Status: DC

## 2016-08-06 NOTE — Patient Instructions (Signed)
Consider shingles vaccine.

## 2016-08-06 NOTE — Progress Notes (Signed)
Subjective:     Patient ID: Shelley Rhodes, female   DOB: 11/02/1960, 55 y.o.   MRN: [id number]  HPI Patient seen for physical exam. She is followed regularly by gynecologist and gets yearly Pap smears and mammograms. She has no specific risk factors for hepatitis C. No history of prior screening. Needs flu vaccine.  Long-standing history of hyperlipidemia. She took Crestor but apparently had leg cramps. She is currently on pravastatin and tolerating well. She took low-dose Lipitor previously and denies any side effects but apparently is only taking 5 mg dosage this was not controlling her lipids very well. This was from another provider.  Exercises somewhat inconsistently.  Past Medical History:  Diagnosis Date  . Appendicitis   . Fibroids    uterine fibroids  . History of heartburn   . Hyperlipidemia   . Hyperlipidemia   . Nausea 2001  . Right lower quadrant abdominal pain 2001   Past Surgical History:  Procedure Laterality Date  . APPENDECTOMY  appendicitis  . CESAREAN SECTION     x2  . FINGER SURGERY     right 4th finger    reports that she has never smoked. She has never used smokeless tobacco. She reports that she does not drink alcohol or use drugs. family history includes Cancer in her mother; Diabetes in her father; Hypertension in her father; Stroke in her father. No Known Allergies   Review of Systems  Constitutional: Negative for activity change, appetite change, fatigue, fever and unexpected weight change.  HENT: Negative for ear pain, hearing loss, sore throat and trouble swallowing.   Eyes: Negative for visual disturbance.  Respiratory: Negative for cough and shortness of breath.   Cardiovascular: Negative for chest pain and palpitations.  Gastrointestinal: Negative for abdominal pain, blood in stool, constipation and diarrhea.  Endocrine: Negative for polydipsia and polyuria.  Genitourinary: Negative for dysuria and hematuria.  Musculoskeletal: Negative for  arthralgias, back pain and myalgias.  Skin: Negative for rash.  Neurological: Negative for dizziness, syncope and headaches.  Hematological: Negative for adenopathy.  Psychiatric/Behavioral: Negative for confusion and dysphoric mood.       Objective:   Physical Exam  Constitutional: She is oriented to person, place, and time. She appears well-developed and well-nourished.  HENT:  Head: Normocephalic and atraumatic.  Eyes: EOM are normal. Pupils are equal, round, and reactive to light.  Neck: Normal range of motion. Neck supple. No thyromegaly present.  Cardiovascular: Normal rate, regular rhythm and normal heart sounds.   No murmur heard. Pulmonary/Chest: Breath sounds normal. No respiratory distress. She has no wheezes. She has no rales.  Abdominal: Soft. Bowel sounds are normal. She exhibits no distension and no mass. There is no tenderness. There is no rebound and no guarding.  Genitourinary:  Genitourinary Comments: Per gyn   Musculoskeletal: Normal range of motion. She exhibits no edema.  Lymphadenopathy:    She has no cervical adenopathy.  Neurological: She is alert and oriented to person, place, and time. She displays normal reflexes. No cranial nerve deficit.  Skin: No rash noted.  Psychiatric: She has a normal mood and affect. Her behavior is normal. Judgment and thought content normal.       Assessment:     Physical exam.  Labs reviewed. She has minimally elevated glucose in prediabetes range and also poorly controlled lipids    Plan:     -Flu vaccine given. She would like to return in one month for shingles vaccine. She has checked on insurance coverage. -We  discussed hepatitis C screening and she will get this with follow-up labs -Minimal thrombocytosis with platelet count 425,000-doubt clinically significant. Repeat CBC at follow-up -Discontinue pravastatin and start Lipitor 20 mg once daily. Recheck lipid and hepatic panel in 2 months -Continue regular  weightbearing exercise.  Eulas Post MD [facility] at [facility]

## 2016-08-06 NOTE — Progress Notes (Signed)
Pre visit review using our clinic review tool, if applicable. No additional management support is needed unless otherwise documented below in the visit note. 

## 2016-08-14 DIAGNOSIS — Z01419 Encounter for gynecological examination (general) (routine) without abnormal findings: Secondary | ICD-10-CM | POA: Diagnosis not present

## 2016-08-15 DIAGNOSIS — Z01419 Encounter for gynecological examination (general) (routine) without abnormal findings: Secondary | ICD-10-CM | POA: Diagnosis not present

## 2016-08-15 DIAGNOSIS — Z6822 Body mass index (BMI) 22.0-22.9, adult: Secondary | ICD-10-CM | POA: Diagnosis not present

## 2016-08-15 DIAGNOSIS — Z1231 Encounter for screening mammogram for malignant neoplasm of breast: Secondary | ICD-10-CM | POA: Diagnosis not present

## 2016-10-16 ENCOUNTER — Ambulatory Visit: Payer: BLUE CROSS/BLUE SHIELD | Admitting: Psychology

## 2016-10-17 DIAGNOSIS — B078 Other viral warts: Secondary | ICD-10-CM | POA: Diagnosis not present

## 2016-10-17 DIAGNOSIS — L820 Inflamed seborrheic keratosis: Secondary | ICD-10-CM | POA: Diagnosis not present

## 2016-11-14 ENCOUNTER — Other Ambulatory Visit (INDEPENDENT_AMBULATORY_CARE_PROVIDER_SITE_OTHER): Payer: BLUE CROSS/BLUE SHIELD

## 2016-11-14 DIAGNOSIS — E785 Hyperlipidemia, unspecified: Secondary | ICD-10-CM

## 2016-11-14 DIAGNOSIS — Z1159 Encounter for screening for other viral diseases: Secondary | ICD-10-CM

## 2016-11-14 DIAGNOSIS — D473 Essential (hemorrhagic) thrombocythemia: Secondary | ICD-10-CM

## 2016-11-14 DIAGNOSIS — D75839 Thrombocytosis, unspecified: Secondary | ICD-10-CM

## 2016-11-14 LAB — CBC
HCT: 41.9 % (ref 36.0–46.0)
HEMOGLOBIN: 14.1 g/dL (ref 12.0–15.0)
MCHC: 33.7 g/dL (ref 30.0–36.0)
MCV: 88.8 fl (ref 78.0–100.0)
PLATELETS: 368 10*3/uL (ref 150.0–400.0)
RBC: 4.72 Mil/uL (ref 3.87–5.11)
RDW: 13.2 % (ref 11.5–15.5)
WBC: 5.2 10*3/uL (ref 4.0–10.5)

## 2016-11-14 LAB — HEPATIC FUNCTION PANEL
ALBUMIN: 4.5 g/dL (ref 3.5–5.2)
ALK PHOS: 68 U/L (ref 39–117)
ALT: 24 U/L (ref 0–35)
AST: 23 U/L (ref 0–37)
Bilirubin, Direct: 0.1 mg/dL (ref 0.0–0.3)
Total Bilirubin: 0.6 mg/dL (ref 0.2–1.2)
Total Protein: 6.9 g/dL (ref 6.0–8.3)

## 2016-11-14 LAB — LIPID PANEL
Cholesterol: 228 mg/dL — ABNORMAL HIGH (ref 0–200)
HDL: 66.9 mg/dL (ref >39.00)
LDL Cholesterol: 134 mg/dL — ABNORMAL HIGH (ref 0–99)
NONHDL: 160.76
Total CHOL/HDL Ratio: 3
Triglycerides: 136 mg/dL (ref 0.0–149.0)
VLDL: 27.2 mg/dL (ref 0.0–40.0)

## 2016-11-15 ENCOUNTER — Telehealth: Payer: Self-pay | Admitting: Family Medicine

## 2016-11-15 LAB — HEPATITIS C ANTIBODY: HCV AB: NEGATIVE

## 2016-11-15 NOTE — Telephone Encounter (Signed)
This is a Dr. Elease Hashimoto pt would like to have the new shingle vaccine she is going to call her insurance company to see if they will pay for it and call back to get scheduled.  Pt is aware that it is a 2 part vaccine.

## 2016-11-15 NOTE — Telephone Encounter (Signed)
When she calls back after contacting her insurance company please schedule her out 4 days. Let me know and I will get the vaccine ordered

## 2017-01-08 ENCOUNTER — Ambulatory Visit (INDEPENDENT_AMBULATORY_CARE_PROVIDER_SITE_OTHER): Payer: BLUE CROSS/BLUE SHIELD

## 2017-01-08 DIAGNOSIS — Z23 Encounter for immunization: Secondary | ICD-10-CM | POA: Diagnosis not present

## 2017-01-08 NOTE — Progress Notes (Signed)
Patient received her first dose of Shingrix Vaccine on 01/08/2017 at 9.30 am. Given by Rosealee Albee CMA.

## 2017-02-14 DIAGNOSIS — H04123 Dry eye syndrome of bilateral lacrimal glands: Secondary | ICD-10-CM | POA: Diagnosis not present

## 2017-02-14 DIAGNOSIS — H43813 Vitreous degeneration, bilateral: Secondary | ICD-10-CM | POA: Diagnosis not present

## 2017-02-14 DIAGNOSIS — H16203 Unspecified keratoconjunctivitis, bilateral: Secondary | ICD-10-CM | POA: Diagnosis not present

## 2017-04-05 ENCOUNTER — Ambulatory Visit (INDEPENDENT_AMBULATORY_CARE_PROVIDER_SITE_OTHER): Payer: BLUE CROSS/BLUE SHIELD

## 2017-04-05 DIAGNOSIS — Z23 Encounter for immunization: Secondary | ICD-10-CM

## 2017-07-19 DIAGNOSIS — D2361 Other benign neoplasm of skin of right upper limb, including shoulder: Secondary | ICD-10-CM | POA: Diagnosis not present

## 2017-07-19 DIAGNOSIS — L738 Other specified follicular disorders: Secondary | ICD-10-CM | POA: Diagnosis not present

## 2017-07-19 DIAGNOSIS — L218 Other seborrheic dermatitis: Secondary | ICD-10-CM | POA: Diagnosis not present

## 2017-07-19 DIAGNOSIS — L720 Epidermal cyst: Secondary | ICD-10-CM | POA: Diagnosis not present

## 2017-07-25 ENCOUNTER — Other Ambulatory Visit: Payer: Self-pay | Admitting: Family Medicine

## 2017-08-09 ENCOUNTER — Encounter: Payer: BLUE CROSS/BLUE SHIELD | Admitting: Family Medicine

## 2017-08-19 DIAGNOSIS — Z1231 Encounter for screening mammogram for malignant neoplasm of breast: Secondary | ICD-10-CM | POA: Diagnosis not present

## 2017-08-19 DIAGNOSIS — Z6822 Body mass index (BMI) 22.0-22.9, adult: Secondary | ICD-10-CM | POA: Diagnosis not present

## 2017-08-19 DIAGNOSIS — Z01419 Encounter for gynecological examination (general) (routine) without abnormal findings: Secondary | ICD-10-CM | POA: Diagnosis not present

## 2017-08-20 ENCOUNTER — Ambulatory Visit: Payer: BLUE CROSS/BLUE SHIELD | Admitting: Family Medicine

## 2017-08-27 ENCOUNTER — Ambulatory Visit (INDEPENDENT_AMBULATORY_CARE_PROVIDER_SITE_OTHER): Payer: BLUE CROSS/BLUE SHIELD | Admitting: Family Medicine

## 2017-08-27 ENCOUNTER — Other Ambulatory Visit: Payer: Self-pay | Admitting: Family Medicine

## 2017-08-27 ENCOUNTER — Encounter: Payer: Self-pay | Admitting: Family Medicine

## 2017-08-27 VITALS — BP 110/70 | HR 57 | Temp 99.3°F | Ht 62.75 in | Wt 124.0 lb

## 2017-08-27 DIAGNOSIS — R7309 Other abnormal glucose: Secondary | ICD-10-CM

## 2017-08-27 DIAGNOSIS — Z0000 Encounter for general adult medical examination without abnormal findings: Secondary | ICD-10-CM

## 2017-08-27 DIAGNOSIS — Z23 Encounter for immunization: Secondary | ICD-10-CM

## 2017-08-27 LAB — CBC WITH DIFFERENTIAL/PLATELET
BASOS ABS: 0.1 10*3/uL (ref 0.0–0.1)
BASOS PCT: 1 % (ref 0.0–3.0)
EOS ABS: 0.2 10*3/uL (ref 0.0–0.7)
Eosinophils Relative: 3.3 % (ref 0.0–5.0)
HCT: 43.2 % (ref 36.0–46.0)
Hemoglobin: 14.3 g/dL (ref 12.0–15.0)
Lymphocytes Relative: 32.3 % (ref 12.0–46.0)
Lymphs Abs: 1.6 10*3/uL (ref 0.7–4.0)
MCHC: 33.2 g/dL (ref 30.0–36.0)
MCV: 90.5 fl (ref 78.0–100.0)
MONO ABS: 0.3 10*3/uL (ref 0.1–1.0)
Monocytes Relative: 6.2 % (ref 3.0–12.0)
NEUTROS ABS: 2.9 10*3/uL (ref 1.4–7.7)
Neutrophils Relative %: 57.2 % (ref 43.0–77.0)
PLATELETS: 362 10*3/uL (ref 150.0–400.0)
RBC: 4.77 Mil/uL (ref 3.87–5.11)
RDW: 13.8 % (ref 11.5–15.5)
WBC: 5 10*3/uL (ref 4.0–10.5)

## 2017-08-27 LAB — HEPATIC FUNCTION PANEL
ALT: 21 U/L (ref 0–35)
AST: 20 U/L (ref 0–37)
Albumin: 4.5 g/dL (ref 3.5–5.2)
Alkaline Phosphatase: 78 U/L (ref 39–117)
BILIRUBIN TOTAL: 0.6 mg/dL (ref 0.2–1.2)
Bilirubin, Direct: 0.1 mg/dL (ref 0.0–0.3)
Total Protein: 7 g/dL (ref 6.0–8.3)

## 2017-08-27 LAB — BASIC METABOLIC PANEL
BUN: 15 mg/dL (ref 6–23)
CHLORIDE: 103 meq/L (ref 96–112)
CO2: 28 meq/L (ref 19–32)
Calcium: 9.8 mg/dL (ref 8.4–10.5)
Creatinine, Ser: 0.63 mg/dL (ref 0.40–1.20)
GFR: 104.22 mL/min (ref >60.00)
Glucose, Bld: 121 mg/dL — ABNORMAL HIGH (ref 70–99)
POTASSIUM: 4.5 meq/L (ref 3.5–5.1)
Sodium: 140 mEq/L (ref 135–145)

## 2017-08-27 LAB — LIPID PANEL
CHOL/HDL RATIO: 3
CHOLESTEROL: 224 mg/dL — AB (ref 0–200)
HDL: 67.3 mg/dL (ref >39.00)
LDL CALC: 125 mg/dL — AB (ref 0–99)
NonHDL: 156.69
TRIGLYCERIDES: 157 mg/dL — AB (ref 0.0–149.0)
VLDL: 31.4 mg/dL (ref 0.0–40.0)

## 2017-08-27 LAB — TSH: TSH: 1.96 u[IU]/mL (ref 0.35–4.50)

## 2017-08-27 NOTE — Progress Notes (Signed)
Subjective:     Patient ID: Shelley Rhodes, female   DOB: [birth year], 56 y.o.   MRN: [id number]  HPI Patient seen for physical exam. Generally very healthy. She has strong family history of hyperlipidemia. She takes Lipitor 20 mg daily. She exercises 4 days per week. No recent chest pains. No specific complaints. She's had new shingles vaccine. Needs flu vaccine. Otherwise, immunizations up-to-date.  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 Family history significant for father having stroke at age 72. No family history of premature CAD. She sees gynecologist for Pap smears and mammograms and states she had those just about a week ago.  Past Medical History:  Diagnosis Date  . Appendicitis   . Fibroids    uterine fibroids  . History of heartburn   . Hyperlipidemia   . Hyperlipidemia   . Nausea 2001  . Right lower quadrant abdominal pain 2001   Past Surgical History:  Procedure Laterality Date  . APPENDECTOMY  appendicitis  . CESAREAN SECTION     x2  . FINGER SURGERY     right 4th finger    reports that  has never smoked. she has never used smokeless tobacco. She reports that she does not drink alcohol or use drugs. family history includes Cancer in her mother; Diabetes in her father; Hypertension in her father; Stroke in her father. No Known Allergies   Review of Systems  Constitutional: Negative for activity change, appetite change, fatigue, fever and unexpected weight change.  HENT: Negative for ear pain, hearing loss, sore throat and trouble swallowing.   Eyes: Negative for visual disturbance.  Respiratory: Negative for cough and shortness of breath.   Cardiovascular: Negative for chest pain and palpitations.  Gastrointestinal: Negative for abdominal pain, blood in stool, constipation and diarrhea.  Endocrine: Negative for polydipsia and polyuria.  Genitourinary: Negative for dysuria and hematuria.  Musculoskeletal: Negative for  arthralgias, back pain and myalgias.  Skin: Negative for rash.  Neurological: Negative for dizziness, syncope and headaches.  Hematological: Negative for adenopathy.  Psychiatric/Behavioral: Negative for confusion and dysphoric mood.       Objective:   Physical Exam  Constitutional: She is oriented to person, place, and time. She appears well-developed and well-nourished.  HENT:  Head: Normocephalic and atraumatic.  Eyes: EOM are normal. Pupils are equal, round, and reactive to light.  Neck: Normal range of motion. Neck supple. No thyromegaly present.  Cardiovascular: Normal rate, regular rhythm and normal heart sounds.  No murmur heard. Pulmonary/Chest: Breath sounds normal. No respiratory distress. She has no wheezes. She has no rales.  Abdominal: Soft. Bowel sounds are normal. She exhibits no distension and no mass. There is no tenderness. There is no rebound and no guarding.  Genitourinary:  Genitourinary Comments: Per gyn   Musculoskeletal: Normal range of motion. She exhibits no edema.  Lymphadenopathy:    She has no cervical adenopathy.  Neurological: She is alert and oriented to person, place, and time. She displays normal reflexes. No cranial nerve deficit.  Skin: No rash noted.  Psychiatric: She has a normal mood and affect. Her behavior is normal. Judgment and thought content normal.       Assessment:     Physical exam. Following issues were addressed    Plan:     -Flu vaccine given -Obtain screening lab work -Repeat colonoscopy in one year -Patient had questions regarding lipoprotein subfraction assessment and she'll check with insurance to see if she has coverage if interested  Eulas Post MD [facility] at [facility]

## 2017-08-27 NOTE — Patient Instructions (Signed)
Lipoproteins Test Why am I having this test? This test measures lipoproteins in your blood. Lipoproteins transport cholesterol, triglycerides, and other fats to and from your tissues and liver. Lipoprotein blood levels are used to predict your risk for coronary artery disease (CAD). Low-density lipoproteins (LDL) carry cholesterol from the liver and deposit it in the cells of your body. These are sometimes called "bad cholesterol." Having high levels of LDL increases your risk for CAD. Very low-density lipoproteins (VLDL) carry triglycerides through the bloodstream and deposit them in tissues. A high level of VLDL also increases your risk for CAD. High-density lipoproteins (HDL) collect cholesterol from your body's tissues and carry it to your liver. HDL is sometimes called "good cholesterol." Having high levels of HDL decreases your risk for CAD. When all types of lipoproteins are measured together, the test is called a lipid profile test. It allows your health care provider to determine if you are at risk for heart disease or other blood vessel problems. What kind of sample is taken? A blood sample is required for this test. It is usually collected by inserting a needle into a vein or by sticking a finger with a small needle. How do I prepare for this test? You may be asked to avoid eating or drinking anything except water for 12-14 hours before the test. What are the reference ranges? Reference ranges are considered healthy ranges established after testing a large group of healthy people. Reference ranges may vary among different people, labs, and hospitals. It is your responsibility to obtain your test results. Ask the lab or department performing the test when and how you will get your results. Reference ranges for different types of lipoprotein values are as follows:  HDL: ? Female: greater than 45 mg/dL or greater than 0.75 mmol/L (SI units). ? Female: greater than 55 mg/dL or greater than 0.91  mmol/L (SI units).  LDL: ? Adult: less than 130 mg/dL. ? Children: less than 110 mg/dL.  VLDL: 7-32 mg/dL.  What do the results mean? Results outside the reference range can mean you are at increased risk for CAD. The following is your risk for heart disease according to HDL levels:  Low: ? Men: greater than 60 mg/dL. ? Women: greater than 70 mg/dL.  Moderate: ? Men: 45-59 mg/dL. ? Women: 55-69 mg/dL.  High: ? Men: 25-44 mg/dL. ? Women: 35-54 mg/dL.  Talk with your health care provider to discuss your results, treatment options, and if necessary, the need for more tests. Talk with your health care provider if you have any questions about your results. Talk with your health care provider to discuss your results, treatment options, and if necessary, the need for more tests. Talk with your health care provider if you have any questions about your results. This information is not intended to replace advice given to you by your health care provider. Make sure you discuss any questions you have with your health care provider. Document Released: 10/27/2004 Document Revised: 05/30/2016 Document Reviewed: 02/12/2014 Elsevier Interactive Patient Education  2018 [employer].

## 2017-09-10 ENCOUNTER — Other Ambulatory Visit (INDEPENDENT_AMBULATORY_CARE_PROVIDER_SITE_OTHER): Payer: BLUE CROSS/BLUE SHIELD

## 2017-09-10 DIAGNOSIS — R7309 Other abnormal glucose: Secondary | ICD-10-CM | POA: Diagnosis not present

## 2017-09-10 LAB — BASIC METABOLIC PANEL
BUN: 15 mg/dL (ref 6–23)
CHLORIDE: 104 meq/L (ref 96–112)
CO2: 29 meq/L (ref 19–32)
Calcium: 9.7 mg/dL (ref 8.4–10.5)
Creatinine, Ser: 0.7 mg/dL (ref 0.40–1.20)
GFR: 92.28 mL/min (ref >60.00)
GLUCOSE: 107 mg/dL — AB (ref 70–99)
POTASSIUM: 4.5 meq/L (ref 3.5–5.1)
SODIUM: 140 meq/L (ref 135–145)

## 2017-09-10 LAB — HEMOGLOBIN A1C: Hgb A1c MFr Bld: 6.4 % (ref 4.6–6.5)

## 2017-10-29 ENCOUNTER — Other Ambulatory Visit: Payer: Self-pay | Admitting: Family Medicine

## 2018-02-20 DIAGNOSIS — H04123 Dry eye syndrome of bilateral lacrimal glands: Secondary | ICD-10-CM | POA: Diagnosis not present

## 2018-02-20 DIAGNOSIS — H524 Presbyopia: Secondary | ICD-10-CM | POA: Diagnosis not present

## 2018-02-20 DIAGNOSIS — H43813 Vitreous degeneration, bilateral: Secondary | ICD-10-CM | POA: Diagnosis not present

## 2018-02-20 DIAGNOSIS — H16203 Unspecified keratoconjunctivitis, bilateral: Secondary | ICD-10-CM | POA: Diagnosis not present

## 2018-03-11 ENCOUNTER — Ambulatory Visit: Payer: Self-pay | Admitting: Family Medicine

## 2018-03-25 ENCOUNTER — Ambulatory Visit: Payer: BLUE CROSS/BLUE SHIELD | Admitting: Family Medicine

## 2018-03-25 ENCOUNTER — Encounter: Payer: Self-pay | Admitting: Family Medicine

## 2018-03-25 VITALS — BP 118/62 | HR 61 | Temp 97.9°F | Wt 120.1 lb

## 2018-03-25 DIAGNOSIS — R7303 Prediabetes: Secondary | ICD-10-CM

## 2018-03-25 DIAGNOSIS — Z7189 Other specified counseling: Secondary | ICD-10-CM

## 2018-03-25 DIAGNOSIS — M79644 Pain in right finger(s): Secondary | ICD-10-CM

## 2018-03-25 DIAGNOSIS — Z7184 Encounter for health counseling related to travel: Secondary | ICD-10-CM

## 2018-03-25 LAB — POCT GLYCOSYLATED HEMOGLOBIN (HGB A1C): HEMOGLOBIN A1C: 6 % — AB (ref 4.0–5.6)

## 2018-03-25 MED ORDER — AZITHROMYCIN 500 MG PO TABS: ORAL_TABLET | ORAL | 0 refills | Status: DC

## 2018-03-25 NOTE — Progress Notes (Signed)
  Subjective:     Patient ID: Shelley Rhodes, female   DOB: 01/15/1961, 57 y.o.   MRN: [id number]  HPI Patient seen for the following issues  History of prediabetes. A1c 6.4% last fall. Since that time she has reduced high glycemic foods. For example, she is eating less rice. She is exercising more. No polyuria or polydipsia  She has had some soreness involving the right hand middle and ring finger. Mild swelling. Right-hand dominant. Denies repetitive use or injury.  Third issue is she has upcoming travel in August to [place]. Requesting antibiotic for traveler's diarrhea  Past Medical History:  Diagnosis Date  . Appendicitis   . Fibroids    uterine fibroids  . History of heartburn   . Hyperlipidemia   . Hyperlipidemia   . Nausea 2001  . Right lower quadrant abdominal pain 2001   Past Surgical History:  Procedure Laterality Date  . APPENDECTOMY  appendicitis  . CESAREAN SECTION     x2  . FINGER SURGERY     right 4th finger    reports that she has never smoked. She has never used smokeless tobacco. She reports that she does not drink alcohol or use drugs. family history includes Cancer in her mother; Diabetes in her father; Hypertension in her father; Stroke in her father. No Known Allergies   Review of Systems  Constitutional: Negative for fatigue.  Eyes: Negative for visual disturbance.  Respiratory: Negative for cough, chest tightness, shortness of breath and wheezing.   Cardiovascular: Negative for chest pain, palpitations and leg swelling.  Endocrine: Negative for polydipsia and polyuria.  Skin: Negative for rash.  Neurological: Negative for dizziness, seizures, syncope, weakness, light-headedness and headaches.       Objective:   Physical Exam  Constitutional: She appears well-developed and well-nourished.  Eyes: Pupils are equal, round, and reactive to light.  Neck: Neck supple. No JVD present. No thyromegaly present.  Cardiovascular: Normal rate and regular rhythm.  Exam reveals no gallop.  Pulmonary/Chest: Effort normal and breath sounds normal. No respiratory distress. She has no wheezes. She has no rales.  Musculoskeletal: She exhibits no edema.  Right-hand examined. She has no erythema or warmth or any visible swelling or nodules involving the right index and middle fingers. Full range of motion  Neurological: She is alert.       Assessment:     #1 prediabetes. A1c improved today 6.0%  #2 travel advice encounter. Patient requesting prescription in the  event of traveler's diarrhea  #3 finger pain involving right index and middle PIP joint. Unremarkable exam-question mild synovitis versus osteoarthritis. No recent injury    Plan:     -Continue regular exercise and low glycemic diet -Reassess in 6 months and recheck A1c then when she gets other lab work -Prescription for Zithromax 500 mg take 2 tablets 1 dose as needed for traveler's diarrhea. She'll be in [place] -  Wendelin Reader W Daneshia Tavano MD [facility] at [facility]

## 2018-03-25 NOTE — Patient Instructions (Signed)
Your A1C is improved today at 6.0%  Keep up the exercise and low glycemic diet.    Let's plan on 6 month follow up.

## 2018-05-02 ENCOUNTER — Other Ambulatory Visit: Payer: Self-pay | Admitting: Family Medicine

## 2018-08-29 ENCOUNTER — Encounter: Payer: Self-pay | Admitting: Internal Medicine

## 2018-09-08 DIAGNOSIS — Z1231 Encounter for screening mammogram for malignant neoplasm of breast: Secondary | ICD-10-CM | POA: Diagnosis not present

## 2018-09-08 DIAGNOSIS — Z01419 Encounter for gynecological examination (general) (routine) without abnormal findings: Secondary | ICD-10-CM | POA: Diagnosis not present

## 2018-09-08 DIAGNOSIS — Z6822 Body mass index (BMI) 22.0-22.9, adult: Secondary | ICD-10-CM | POA: Diagnosis not present

## 2018-10-03 ENCOUNTER — Ambulatory Visit (AMBULATORY_SURGERY_CENTER): Payer: Self-pay | Admitting: *Deleted

## 2018-10-03 ENCOUNTER — Encounter: Payer: Self-pay | Admitting: Internal Medicine

## 2018-10-03 VITALS — Ht 62.0 in | Wt 126.0 lb

## 2018-10-03 DIAGNOSIS — Z86010 Personal history of colonic polyps: Secondary | ICD-10-CM

## 2018-10-03 MED ORDER — NA SULFATE-K SULFATE-MG SULF 17.5-3.13-1.6 GM/177ML PO SOLN: 1.0000 | Freq: Once | ORAL | 0 refills | Status: AC

## 2018-10-03 NOTE — Progress Notes (Signed)
No egg or soy allergy known to patient  No issues with past sedation with any surgeries  or procedures, no intubation problems  No diet pills per patient No home 02 use per patient  No blood thinners per patient  Pt denies issues with constipation  No A fib or A flutter  EMMI video sent to pt's e mail -  Pt is asking the procedure and anesthesia is billed together- I explained we do not do billing in [place]  Suprep coupon $15 to pt in [place]

## 2018-10-10 ENCOUNTER — Encounter: Payer: Self-pay | Admitting: Internal Medicine

## 2018-10-10 ENCOUNTER — Ambulatory Visit (AMBULATORY_SURGERY_CENTER): Payer: BLUE CROSS/BLUE SHIELD | Admitting: Internal Medicine

## 2018-10-10 VITALS — BP 96/49 | HR 52 | Temp 97.8°F | Resp 19 | Ht 62.0 in | Wt 126.0 lb

## 2018-10-10 DIAGNOSIS — Z86010 Personal history of colonic polyps: Secondary | ICD-10-CM | POA: Diagnosis not present

## 2018-10-10 MED ORDER — SODIUM CHLORIDE 0.9 % IV SOLN: 500.0000 mL | Freq: Once | INTRAVENOUS | Status: DC

## 2018-10-10 NOTE — Patient Instructions (Signed)
Discharge instructions given. Handout on Hemorrhoids. Resume previous medications. YOU HAD AN ENDOSCOPIC PROCEDURE TODAY AT [facility]:   Refer to the procedure report that was given to you for any specific questions about what was found during the examination.  If the procedure report does not answer your questions, please call your gastroenterologist to clarify.  If you requested that your care partner not be given the details of your procedure findings, then the procedure report has been included in a sealed envelope for you to review at your convenience later.  YOU SHOULD EXPECT: Some feelings of bloating in the abdomen. Passage of more gas than usual.  Walking can help get rid of the air that was put into your GI tract during the procedure and reduce the bloating. If you had a lower endoscopy (such as a colonoscopy or flexible sigmoidoscopy) you may notice spotting of blood in your stool or on the toilet paper. If you underwent a bowel prep for your procedure, you may not have a normal bowel movement for a few days.  Please Note:  You might notice some irritation and congestion in your nose or some drainage.  This is from the oxygen used during your procedure.  There is no need for concern and it should clear up in a day or so.  SYMPTOMS TO REPORT IMMEDIATELY:   Following lower endoscopy (colonoscopy or flexible sigmoidoscopy):  Excessive amounts of blood in the stool  Significant tenderness or worsening of abdominal pains  Swelling of the abdomen that is new, acute  Fever of 100F or higher   For urgent or emergent issues, a gastroenterologist can be reached at any hour by calling [phone].   DIET:  We do recommend a small meal at first, but then you may proceed to your regular diet.  Drink plenty of fluids but you should avoid alcoholic beverages for 24 hours.  ACTIVITY:  You should plan to take it easy for the rest of today and you should NOT DRIVE or use heavy  machinery until tomorrow (because of the sedation medicines used during the test).    FOLLOW UP: Our staff will call the number listed on your records the next business day following your procedure to check on you and address any questions or concerns that you may have regarding the information given to you following your procedure. If we do not reach you, we will leave a message.  However, if you are feeling well and you are not experiencing any problems, there is no need to return our call.  We will assume that you have returned to your regular daily activities without incident.  If any biopsies were taken you will be contacted by phone or by letter within the next 1-3 weeks.  Please call us at [phone] if you have not heard about the biopsies in 3 weeks.    SIGNATURES/CONFIDENTIALITY: You and/or your care partner have signed paperwork which will be entered into your electronic medical record.  These signatures attest to the fact that that the information above on your After Visit Summary has been reviewed and is understood.  Full responsibility of the confidentiality of this discharge information lies with you and/or your care-partner.

## 2018-10-10 NOTE — Op Note (Signed)
Shelley Rhodes: Shelley Rhodes Procedure Date: 10/10/2018 10:29 AM MRN: [id number] Endoscopist: Docia Chuck. Henrene Pastor , MD Age: 58 Referring MD:  Date of Birth: May 19, 1961 Gender: Female Account #: [id number] Procedure:                Colonoscopy Indications:              High risk colon cancer surveillance: Personal                            history of non-advanced adenoma. Previous                            examination December 2014 Medicines:                Monitored Anesthesia Care Procedure:                Pre-Anesthesia Assessment:                           - Prior to the procedure, a History and Physical                            was performed, and patient medications and                            allergies were reviewed. The patient's tolerance of                            previous anesthesia was also reviewed. The risks                            and benefits of the procedure and the sedation                            options and risks were discussed with the patient.                            All questions were answered, and informed consent                            was obtained. Prior Anticoagulants: The patient has                            taken no previous anticoagulant or antiplatelet                            agents. ASA Grade Assessment: II - A patient with                            mild systemic disease. After reviewing the risks                            and benefits, the patient was deemed in  satisfactory condition to undergo the procedure.                           After obtaining informed consent, the colonoscope                            was passed under direct vision. Throughout the                            procedure, the patient's blood pressure, pulse, and                            oxygen saturations were monitored continuously. The                            Colonoscope was introduced through the anus and                         advanced to the the cecum, identified by                            appendiceal orifice and ileocecal valve. The                            ileocecal valve, appendiceal orifice, and rectum                            were photographed. The quality of the bowel                            preparation was good. The colonoscopy was performed                            without difficulty. The patient tolerated the                            procedure well. The bowel preparation used was                            SUPREP. Scope In: 10:34:31 AM Scope Out: 10:48:10 AM Scope Withdrawal Time: 0 hours 9 minutes 36 seconds  Total Procedure Duration: 0 hours 13 minutes 39 seconds  Findings:                 Internal hemorrhoids were found during retroflexion.                           The exam was otherwise without abnormality on                            direct and retroflexion views. Complications:            No immediate complications. Estimated blood loss:                            None. Estimated Blood Loss:  Estimated blood loss: none. Impression:               - Internal hemorrhoids.                           - The examination was otherwise normal on direct                            and retroflexion views.                           - No specimens collected. Recommendation:           - Repeat colonoscopy in 5 years for surveillance.                           - Patient has a contact number available for                            emergencies. The signs and symptoms of potential                            delayed complications were discussed with the                            patient. Return to normal activities tomorrow.                            Written discharge instructions were provided to the                            patient.                           - Resume previous diet.                           - Continue present medications. Docia Chuck. Henrene Pastor, MD 10/10/2018  10:51:51 AM This report has been signed electronically.

## 2018-10-10 NOTE — Progress Notes (Signed)
Pt's states no medical or surgical changes since previsit or office visit. 

## 2018-10-10 NOTE — Progress Notes (Signed)
Report to PACU, RN, vss, BBS= Clear.  

## 2018-10-13 ENCOUNTER — Telehealth: Payer: Self-pay

## 2018-10-13 NOTE — Telephone Encounter (Signed)
  Follow up Call-  Call back number 10/10/2018  Post procedure Call Back phone  # [phone]  Permission to leave phone message Yes  Some recent data might be hidden     Patient questions:  Do you have a fever, pain , or abdominal swelling? No. Pain Score  0 *  Have you tolerated food without any problems? Yes.    Have you been able to return to your normal activities? Yes.    Do you have any questions about your discharge instructions: Diet   No. Medications  No. Follow up visit  No.  Do you have questions or concerns about your Care? Yes.  Patient said she seems to be a little constipated as she was before the procedure. I advised her to try a capful of miralax a day for several days and see if that helped. She understood and said she would get back with [place] if she had further problems.   Actions: * If pain score is 4 or above: No action needed, pain <4.

## 2018-10-13 NOTE — Telephone Encounter (Signed)
  Follow up Call-  Call back number 10/10/2018  Post procedure Call Back phone  # [phone]  Permission to leave phone message Yes  Some recent data might be hidden     Left message

## 2018-10-26 ENCOUNTER — Other Ambulatory Visit: Payer: Self-pay | Admitting: Family Medicine

## 2019-01-21 ENCOUNTER — Other Ambulatory Visit: Payer: Self-pay | Admitting: Family Medicine

## 2019-01-21 NOTE — Telephone Encounter (Signed)
Called patient and let her know that we have not had labs done for her cholesterol in over a year and asked if patient would like to have a telephone visit to update so she could continue her medication and patient declines at this time and will call back to schedule appointment for medication refill. I let her know that we will decline medicine until she can do a visit with Dr. Elease Hashimoto. Patient verbalized an understanding.

## 2019-01-21 NOTE — Telephone Encounter (Signed)
Needs appointment for med refill. Last labs done in 2018.

## 2019-02-11 ENCOUNTER — Ambulatory Visit (INDEPENDENT_AMBULATORY_CARE_PROVIDER_SITE_OTHER): Payer: BLUE CROSS/BLUE SHIELD | Admitting: Family Medicine

## 2019-02-11 ENCOUNTER — Other Ambulatory Visit: Payer: Self-pay

## 2019-02-11 DIAGNOSIS — R7303 Prediabetes: Secondary | ICD-10-CM

## 2019-02-11 DIAGNOSIS — E785 Hyperlipidemia, unspecified: Secondary | ICD-10-CM

## 2019-02-11 MED ORDER — ATORVASTATIN CALCIUM 20 MG PO TABS: 20.0000 mg | ORAL_TABLET | Freq: Every day | ORAL | 1 refills | Status: DC

## 2019-02-11 NOTE — Progress Notes (Signed)
Patient ID: Shelley Rhodes, female   DOB: December 20, 1960, 58 y.o.   MRN: [id number]  This visit type was conducted due to national recommendations for restrictions regarding the COVID-19 pandemic in an effort to limit this patient's exposure and mitigate transmission in our community.   Virtual Visit via Telephone Note  I connected with Kaityln Zapf on 02/11/19 at 11:30 AM EDT by telephone and verified that I am speaking with the correct person using two identifiers.   I discussed the limitations, risks, security and privacy concerns of performing an evaluation and management service by telephone and the availability of in person appointments. I also discussed with the patient that there may be a patient responsible charge related to this service. The patient expressed understanding and agreed to proceed.  Location patient: home Location provider: work or home office Participants present for the call: patient, provider Patient did not have a visit in the prior 7 days to address this/these issue(s).   History of Present Illness: Follow-up hyperlipidemia.  Patient takes atorvastatin 20 mg daily.  She is overdue for labs there is very anxious about coming into the office at this time.  She is tolerating medication without side effects.  Her last lipids were November 2018.  She has history of prediabetes range blood sugars.  No recent polyuria or polydipsia.  Last A1c 6.0%.   Observations/Objective: Patient sounds cheerful and well on the phone. I do not appreciate any SOB. Speech and thought processing are grossly intact. Patient reported vitals:  Assessment and Plan: #1 hyperlipidemia -We need to get some follow-up labs.  Patient agrees to come in in about 3 months after things settle down.  We agreed to refill her Lipitor for 3 months until follow-up  #2 history of prediabetes -Continue low glycemic diet and regular exercise and check A1c at follow-up  Follow Up Instructions:  -She will consider  setting up complete physical within the next 3 months  I did not refer this patient for an OV in the next 24 hours for this/these issue(s).  I discussed the assessment and treatment plan with the patient. The patient was provided an opportunity to ask questions and all were answered. The patient agreed with the plan and demonstrated an understanding of the instructions.   The patient was advised to call back or seek an in-person evaluation if the symptoms worsen or if the condition fails to improve as anticipated.  I provided 12 minutes of non-face-to-face time during this encounter.   Carolann Littler, MD

## 2019-04-08 DIAGNOSIS — D2361 Other benign neoplasm of skin of right upper limb, including shoulder: Secondary | ICD-10-CM | POA: Diagnosis not present

## 2019-04-08 DIAGNOSIS — L309 Dermatitis, unspecified: Secondary | ICD-10-CM | POA: Diagnosis not present

## 2019-04-08 DIAGNOSIS — L821 Other seborrheic keratosis: Secondary | ICD-10-CM | POA: Diagnosis not present

## 2019-04-08 DIAGNOSIS — L918 Other hypertrophic disorders of the skin: Secondary | ICD-10-CM | POA: Diagnosis not present

## 2019-08-20 DIAGNOSIS — H16203 Unspecified keratoconjunctivitis, bilateral: Secondary | ICD-10-CM | POA: Diagnosis not present

## 2019-08-20 DIAGNOSIS — H5213 Myopia, bilateral: Secondary | ICD-10-CM | POA: Diagnosis not present

## 2019-08-20 DIAGNOSIS — H10413 Chronic giant papillary conjunctivitis, bilateral: Secondary | ICD-10-CM | POA: Diagnosis not present

## 2019-08-20 DIAGNOSIS — H10403 Unspecified chronic conjunctivitis, bilateral: Secondary | ICD-10-CM | POA: Diagnosis not present

## 2019-08-22 ENCOUNTER — Other Ambulatory Visit: Payer: Self-pay | Admitting: Family Medicine

## 2019-09-11 ENCOUNTER — Other Ambulatory Visit: Payer: Self-pay

## 2019-09-11 ENCOUNTER — Ambulatory Visit (INDEPENDENT_AMBULATORY_CARE_PROVIDER_SITE_OTHER): Payer: BC Managed Care – PPO | Admitting: Family Medicine

## 2019-09-11 ENCOUNTER — Encounter: Payer: Self-pay | Admitting: Family Medicine

## 2019-09-11 VITALS — BP 116/64 | HR 56 | Temp 97.7°F | Ht 62.25 in | Wt 129.0 lb

## 2019-09-11 DIAGNOSIS — Z0000 Encounter for general adult medical examination without abnormal findings: Secondary | ICD-10-CM | POA: Diagnosis not present

## 2019-09-11 LAB — CBC WITH DIFFERENTIAL/PLATELET
Basophils Absolute: 0 10*3/uL (ref 0.0–0.1)
Basophils Relative: 0.8 % (ref 0.0–3.0)
Eosinophils Absolute: 0.1 10*3/uL (ref 0.0–0.7)
Eosinophils Relative: 2.3 % (ref 0.0–5.0)
HCT: 43.2 % (ref 36.0–46.0)
Hemoglobin: 14.3 g/dL (ref 12.0–15.0)
Lymphocytes Relative: 32.8 % (ref 12.0–46.0)
Lymphs Abs: 1.8 10*3/uL (ref 0.7–4.0)
MCHC: 33.1 g/dL (ref 30.0–36.0)
MCV: 89.9 fl (ref 78.0–100.0)
Monocytes Absolute: 0.4 10*3/uL (ref 0.1–1.0)
Monocytes Relative: 6.8 % (ref 3.0–12.0)
Neutro Abs: 3.2 10*3/uL (ref 1.4–7.7)
Neutrophils Relative %: 57.3 % (ref 43.0–77.0)
Platelets: 379 10*3/uL (ref 150.0–400.0)
RBC: 4.8 Mil/uL (ref 3.87–5.11)
RDW: 13.5 % (ref 11.5–15.5)
WBC: 5.6 10*3/uL (ref 4.0–10.5)

## 2019-09-11 LAB — BASIC METABOLIC PANEL
BUN: 13 mg/dL (ref 6–23)
CO2: 25 mEq/L (ref 19–32)
Calcium: 9.4 mg/dL (ref 8.4–10.5)
Chloride: 103 mEq/L (ref 96–112)
Creatinine, Ser: 0.65 mg/dL (ref 0.40–1.20)
GFR: 93.89 mL/min (ref >60.00)
Glucose, Bld: 101 mg/dL — ABNORMAL HIGH (ref 70–99)
Potassium: 4.2 mEq/L (ref 3.5–5.1)
Sodium: 140 mEq/L (ref 135–145)

## 2019-09-11 LAB — LIPID PANEL
Cholesterol: 246 mg/dL — ABNORMAL HIGH (ref 0–200)
HDL: 65 mg/dL (ref >39.00)
LDL Cholesterol: 153 mg/dL — ABNORMAL HIGH (ref 0–99)
NonHDL: 180.96
Total CHOL/HDL Ratio: 4
Triglycerides: 138 mg/dL (ref 0.0–149.0)
VLDL: 27.6 mg/dL (ref 0.0–40.0)

## 2019-09-11 LAB — HEPATIC FUNCTION PANEL
ALT: 22 U/L (ref 0–35)
AST: 19 U/L (ref 0–37)
Albumin: 4.7 g/dL (ref 3.5–5.2)
Alkaline Phosphatase: 80 U/L (ref 39–117)
Bilirubin, Direct: 0.1 mg/dL (ref 0.0–0.3)
Total Bilirubin: 0.7 mg/dL (ref 0.2–1.2)
Total Protein: 7.1 g/dL (ref 6.0–8.3)

## 2019-09-11 LAB — HEMOGLOBIN A1C: Hgb A1c MFr Bld: 6.5 % (ref 4.6–6.5)

## 2019-09-11 LAB — TSH: TSH: 1.35 u[IU]/mL (ref 0.35–4.50)

## 2019-09-11 MED ORDER — ATORVASTATIN CALCIUM 20 MG PO TABS: 20.0000 mg | ORAL_TABLET | Freq: Every day | ORAL | 3 refills | Status: DC

## 2019-09-11 NOTE — Patient Instructions (Signed)

## 2019-09-11 NOTE — Progress Notes (Signed)
Subjective:     Patient ID: Shelley Rhodes, female   DOB: [birth year], 58 y.o.   MRN: [id number]  HPI   Ms. Mossholder is seen for physical exam.  She sees gynecologist yearly.  Flu vaccine already given.  Previous hepatitis C screening negative.  Repeat colonoscopy due 2025.  Pap smear and mammograms through GYN and up-to-date.  Tetanus due next year.  Previous shingles vaccine given  She takes Lipitor for hyperlipidemia.  No other regular medications.  She is overdue for lab work.  Family history reviewed.  Father had type 2 diabetes.  Only complaint today is he has intermittent symptoms of her left ear feeling "blocked ".  Sometimes this can last for just a hour or 2.  No drainage.  No hearing loss.  No vertigo.  No ear pain.  No headaches.  Denies nasal congestion.  Past Medical History:  Diagnosis Date  . Appendicitis   . Arthritis    knee right   . Constipation    occasionally not chronic - uses OTC stool softener   . Fibroids    uterine fibroids  . Hemorrhoids   . History of heartburn   . Hyperlipidemia   . Hyperlipidemia   . Nausea 2001  . Right lower quadrant abdominal pain 2001   Past Surgical History:  Procedure Laterality Date  . APPENDECTOMY  appendicitis  . CESAREAN SECTION     x2  . COLONOSCOPY    . FINGER SURGERY     right 4th finger  . POLYPECTOMY      reports that she has never smoked. She has never used smokeless tobacco. She reports that she does not drink alcohol or use drugs. family history includes Cancer in her mother; Diabetes in her father; Hypertension in her father; Lung cancer in her mother; Stroke in her father. No Known Allergies   Review of Systems  Constitutional: Negative for activity change, appetite change, fatigue, fever and unexpected weight change.  HENT: Negative for ear pain, hearing loss, sore throat and trouble swallowing.   Eyes: Negative for visual disturbance.  Respiratory: Negative for cough and shortness of breath.   Cardiovascular:  Negative for chest pain and palpitations.  Gastrointestinal: Negative for abdominal pain, blood in stool, constipation and diarrhea.  Genitourinary: Negative for dysuria and hematuria.  Musculoskeletal: Negative for arthralgias, back pain and myalgias.  Skin: Negative for rash.  Neurological: Negative for dizziness, syncope and headaches.  Hematological: Negative for adenopathy.  Psychiatric/Behavioral: Negative for confusion and dysphoric mood.       Objective:   Physical Exam Constitutional:      Appearance: She is well-developed.  HENT:     Head: Normocephalic and atraumatic.  Eyes:     Pupils: Pupils are equal, round, and reactive to light.  Neck:     Musculoskeletal: Normal range of motion and neck supple.     Thyroid: No thyromegaly.  Cardiovascular:     Rate and Rhythm: Normal rate and regular rhythm.     Heart sounds: Normal heart sounds. No murmur.  Pulmonary:     Effort: No respiratory distress.     Breath sounds: Normal breath sounds. No wheezing or rales.  Abdominal:     General: Bowel sounds are normal. There is no distension.     Palpations: Abdomen is soft. There is no mass.     Tenderness: There is no abdominal tenderness. There is no guarding or rebound.  Musculoskeletal: Normal range of motion.  Lymphadenopathy:     Cervical:  No cervical adenopathy.  Skin:    Findings: No rash.  Neurological:     Mental Status: She is alert and oriented to person, place, and time.     Cranial Nerves: No cranial nerve deficit.     Deep Tendon Reflexes: Reflexes normal.  Psychiatric:        Behavior: Behavior normal.        Thought Content: Thought content normal.        Judgment: Judgment normal.        Assessment:     Physical exam.  We discussed the following health maintenance issues    Plan:     -We discussed importance of regular weightbearing exercise and she will continue with regular GYN follow-up regarding her mammograms and Pap smears  -Refilled  Lipitor for 1 year  -Obtain follow-up labs.  Will include A1c with her prior history of prediabetes range blood sugars and family history of diabetes  Eulas Post MD [facility]

## 2019-12-28 ENCOUNTER — Ambulatory Visit: Payer: BC Managed Care – PPO | Admitting: Family Medicine

## 2020-02-15 ENCOUNTER — Other Ambulatory Visit: Payer: Self-pay

## 2020-02-15 ENCOUNTER — Telehealth: Payer: Self-pay | Admitting: Family Medicine

## 2020-02-15 NOTE — Telephone Encounter (Signed)
Pt scheduled for tdap

## 2020-02-15 NOTE — Telephone Encounter (Signed)
Pt  Would like to know if she can come in and have  a tdap

## 2020-02-16 ENCOUNTER — Ambulatory Visit (INDEPENDENT_AMBULATORY_CARE_PROVIDER_SITE_OTHER): Payer: BC Managed Care – PPO

## 2020-02-16 DIAGNOSIS — Z23 Encounter for immunization: Secondary | ICD-10-CM

## 2020-02-16 NOTE — Progress Notes (Signed)
Pt tolerated shot well 

## 2020-03-04 DIAGNOSIS — D235 Other benign neoplasm of skin of trunk: Secondary | ICD-10-CM | POA: Diagnosis not present

## 2020-03-04 DIAGNOSIS — L738 Other specified follicular disorders: Secondary | ICD-10-CM | POA: Diagnosis not present

## 2020-03-04 DIAGNOSIS — L720 Epidermal cyst: Secondary | ICD-10-CM | POA: Diagnosis not present

## 2020-03-04 DIAGNOSIS — L820 Inflamed seborrheic keratosis: Secondary | ICD-10-CM | POA: Diagnosis not present

## 2020-03-04 DIAGNOSIS — D1801 Hemangioma of skin and subcutaneous tissue: Secondary | ICD-10-CM | POA: Diagnosis not present

## 2020-04-13 DIAGNOSIS — Z1231 Encounter for screening mammogram for malignant neoplasm of breast: Secondary | ICD-10-CM | POA: Diagnosis not present

## 2020-04-13 DIAGNOSIS — Z01419 Encounter for gynecological examination (general) (routine) without abnormal findings: Secondary | ICD-10-CM | POA: Diagnosis not present

## 2020-04-13 DIAGNOSIS — Z6823 Body mass index (BMI) 23.0-23.9, adult: Secondary | ICD-10-CM | POA: Diagnosis not present

## 2020-04-13 DIAGNOSIS — B373 Candidiasis of vulva and vagina: Secondary | ICD-10-CM | POA: Diagnosis not present

## 2020-06-01 DIAGNOSIS — H16203 Unspecified keratoconjunctivitis, bilateral: Secondary | ICD-10-CM | POA: Diagnosis not present

## 2020-06-01 DIAGNOSIS — H43812 Vitreous degeneration, left eye: Secondary | ICD-10-CM | POA: Diagnosis not present

## 2020-07-21 DIAGNOSIS — H10413 Chronic giant papillary conjunctivitis, bilateral: Secondary | ICD-10-CM | POA: Diagnosis not present

## 2020-09-03 ENCOUNTER — Other Ambulatory Visit: Payer: Self-pay | Admitting: Family Medicine

## 2020-09-12 ENCOUNTER — Encounter: Payer: Self-pay | Admitting: Family Medicine

## 2020-09-12 ENCOUNTER — Other Ambulatory Visit: Payer: Self-pay

## 2020-09-12 ENCOUNTER — Ambulatory Visit (INDEPENDENT_AMBULATORY_CARE_PROVIDER_SITE_OTHER): Payer: BC Managed Care – PPO | Admitting: Family Medicine

## 2020-09-12 VITALS — BP 120/78 | HR 60 | Temp 97.5°F | Resp 12 | Ht 62.5 in | Wt 130.5 lb

## 2020-09-12 DIAGNOSIS — Z23 Encounter for immunization: Secondary | ICD-10-CM | POA: Diagnosis not present

## 2020-09-12 DIAGNOSIS — Z0000 Encounter for general adult medical examination without abnormal findings: Secondary | ICD-10-CM | POA: Diagnosis not present

## 2020-09-12 MED ORDER — ATORVASTATIN CALCIUM 20 MG PO TABS: 20.0000 mg | ORAL_TABLET | Freq: Every day | ORAL | 3 refills | Status: DC

## 2020-09-12 NOTE — Progress Notes (Signed)
i

## 2020-09-12 NOTE — Patient Instructions (Signed)

## 2020-09-12 NOTE — Progress Notes (Signed)
Established Patient Office Visit  Subjective:  Patient ID: Shelley Rhodes, female    DOB: [birth year]  Age: 59 y.o. MRN: [id number]  CC:  Chief Complaint  Patient presents with  . Annual Exam    HPI Shelley Rhodes presents for annual physical exam.  She still sees gynecologist yearly.  She has history of hyperlipidemia treated with Lipitor 20 mg daily.  She is taking this with no side effects.  Generally feels well.  She has gained some weight from 2 years ago.  Most of this occurred back between 2019 and 2020.  Her weight is relatively stable since last year.  She is currently not exercising regularly.    Health maintenance reviewed -Needs flu vaccine -Covid vaccines already given.  She is waiting to get booster -Previous hepatitis C screen negative -Tetanus due 2031 -Colonoscopy due 2025 -Last mammogram was 06/22/2020 -Pap smear up-to-date -Previous shingles vaccine already given  -Family history-Mom died of lung cancer complications.  She was a non-smoker.  Father had type 2 diabetes and died of stroke complications.  He also had hypertension.  She has a brother that was recently diagnosed with diabetes  Social history-she is married.  Never smoked.  No alcohol.  She has 1 son who is an ophthalmologist up in [place]  Past Medical History:  Diagnosis Date  . Appendicitis   . Arthritis    knee right   . Constipation    occasionally not chronic - uses OTC stool softener   . Fibroids    uterine fibroids  . Hemorrhoids   . History of heartburn   . Hyperlipidemia   . Hyperlipidemia   . Nausea 2001  . Right lower quadrant abdominal pain 2001    Past Surgical History:  Procedure Laterality Date  . APPENDECTOMY  appendicitis  . CESAREAN SECTION     x2  . COLONOSCOPY    . FINGER SURGERY     right 4th finger  . POLYPECTOMY      Family History  Problem Relation Age of Onset  . Cancer Mother        Lung  . Lung cancer Mother   . Stroke Father   . Hypertension Father   .  Diabetes Father   . Diabetes Brother   . Colon cancer Neg Hx   . Colon polyps Neg Hx   . Esophageal cancer Neg Hx   . Rectal cancer Neg Hx   . Stomach cancer Neg Hx     Social History   Socioeconomic History  . Marital status: Married    Spouse name: Not on file  . Number of children: Not on file  . Years of education: Not on file  . Highest education level: Not on file  Occupational History  . Not on file  Tobacco Use  . Smoking status: Never Smoker  . Smokeless tobacco: Never Used  Vaping Use  . Vaping Use: Never used  Substance and Sexual Activity  . Alcohol use: No  . Drug use: No  . Sexual activity: Not on file  Other Topics Concern  . Not on file  Social History Narrative  . Not on file   Social Determinants of Health   Financial Resource Strain:   . Difficulty of Paying Living Expenses: Not on file  Food Insecurity:   . Worried About Charity fundraiser in the Last Year: Not on file  . Ran Out of Food in the Last Year: Not on file  Transportation Needs:   .  Lack of Transportation (Medical): Not on file  . Lack of Transportation (Non-Medical): Not on file  Physical Activity:   . Days of Exercise per Week: Not on file  . Minutes of Exercise per Session: Not on file  Stress:   . Feeling of Stress : Not on file  Social Connections:   . Frequency of Communication with Friends and Family: Not on file  . Frequency of Social Gatherings with Friends and Family: Not on file  . Attends Religious Services: Not on file  . Active Member of Clubs or Organizations: Not on file  . Attends Archivist Meetings: Not on file  . Marital Status: Not on file  Intimate Partner Violence:   . Fear of Current or Ex-Partner: Not on file  . Emotionally Abused: Not on file  . Physically Abused: Not on file  . Sexually Abused: Not on file    Outpatient Medications Prior to Visit  Medication Sig Dispense Refill  . Cholecalciferol (VITAMIN D-3) 1000 UNITS CAPS Take  2,000 Units by mouth daily.     Marland Kitchen atorvastatin (LIPITOR) 20 MG tablet TAKE 1 TABLET BY MOUTH EVERY DAY 90 tablet 3  . ASPIRIN 81 PO Take every other day by mouth.     . hydrocortisone 2.5 % cream APPLY TO AFFECTED AREA TWICE A DAY    . ketoconazole (NIZORAL) 2 % cream APPLY TO AFFECTED AREA TWICE A DAY     No facility-administered medications prior to visit.    No Known Allergies  ROS Review of Systems  Constitutional: Negative for activity change, appetite change, fatigue, fever and unexpected weight change.  HENT: Negative for ear pain, hearing loss, sore throat and trouble swallowing.   Eyes: Negative for visual disturbance.  Respiratory: Negative for cough and shortness of breath.   Cardiovascular: Negative for chest pain and palpitations.  Gastrointestinal: Negative for abdominal pain, blood in stool, constipation and diarrhea.  Endocrine: Negative for polydipsia and polyuria.  Genitourinary: Negative for dysuria and hematuria.  Musculoskeletal: Negative for arthralgias, back pain and myalgias.  Skin: Negative for rash.  Neurological: Negative for dizziness, syncope and headaches.  Hematological: Negative for adenopathy.  Psychiatric/Behavioral: Negative for confusion and dysphoric mood.      Objective:    Physical Exam Constitutional:      Appearance: She is well-developed.  HENT:     Head: Normocephalic and atraumatic.  Eyes:     Pupils: Pupils are equal, round, and reactive to light.  Neck:     Thyroid: No thyromegaly.  Cardiovascular:     Rate and Rhythm: Normal rate and regular rhythm.     Heart sounds: Normal heart sounds. No murmur heard.   Pulmonary:     Effort: No respiratory distress.     Breath sounds: Normal breath sounds. No wheezing or rales.  Abdominal:     General: Bowel sounds are normal. There is no distension.     Palpations: Abdomen is soft. There is no mass.     Tenderness: There is no abdominal tenderness. There is no guarding or rebound.    Genitourinary:    Comments: Per GYN Musculoskeletal:        General: Normal range of motion.     Cervical back: Normal range of motion and neck supple.  Lymphadenopathy:     Cervical: No cervical adenopathy.  Skin:    Findings: No rash.  Neurological:     Mental Status: She is alert and oriented to person, place, and time.  Cranial Nerves: No cranial nerve deficit.     Deep Tendon Reflexes: Reflexes normal.  Psychiatric:        Behavior: Behavior normal.        Thought Content: Thought content normal.        Judgment: Judgment normal.     BP 120/78 (BP Location: Left Arm, Cuff Size: Normal)   Pulse 60   Temp (!) 97.5 F (36.4 C) (Oral)   Resp 12   Ht 5' 2.5" (1.588 m)   Wt 130 lb 8 oz (59.2 kg)   LMP 08/25/2013   SpO2 98%   BMI 23.49 kg/m  Wt Readings from Last 3 Encounters:  09/12/20 130 lb 8 oz (59.2 kg)  09/11/19 129 lb (58.5 kg)  10/10/18 126 lb (57.2 kg)     Health Maintenance Due  Topic Date Due  . HIV Screening  Never done    There are no preventive care reminders to display for this patient.  Lab Results  Component Value Date   TSH 1.35 09/11/2019   Lab Results  Component Value Date   WBC 5.6 09/11/2019   HGB 14.3 09/11/2019   HCT 43.2 09/11/2019   MCV 89.9 09/11/2019   PLT 379.0 09/11/2019   Lab Results  Component Value Date   NA 140 09/11/2019   K 4.2 09/11/2019   CO2 25 09/11/2019   GLUCOSE 101 (H) 09/11/2019   BUN 13 09/11/2019   CREATININE 0.65 09/11/2019   BILITOT 0.7 09/11/2019   ALKPHOS 80 09/11/2019   AST 19 09/11/2019   ALT 22 09/11/2019   PROT 7.1 09/11/2019   ALBUMIN 4.7 09/11/2019   CALCIUM 9.4 09/11/2019   GFR 93.89 09/11/2019   Lab Results  Component Value Date   CHOL 246 (H) 09/11/2019   Lab Results  Component Value Date   HDL 65.00 09/11/2019   Lab Results  Component Value Date   LDLCALC 153 (H) 09/11/2019   Lab Results  Component Value Date   TRIG 138.0 09/11/2019   Lab Results  Component Value  Date   CHOLHDL 4 09/11/2019   Lab Results  Component Value Date   HGBA1C 6.5 09/11/2019      Assessment & Plan:   Problem List Items Addressed This Visit    None    Visit Diagnoses    Physical exam    -  Primary   Relevant Orders   Basic metabolic panel   Lipid panel   CBC with Differential/Platelet   TSH   Hepatic function panel   Hemoglobin A1c   Influenza vaccine administered       Relevant Orders   Flu Vaccine QUAD 36+ mos IM (Fluarix, Fluzone & Afluria Quad PF (Completed)    -Flu vaccine given -Continue regular exercise habits -Obtain screening labs.  Will include A1c with family history of type 2 diabetes. -She plans to continue with annual follow-up with GYN  Meds ordered this encounter  Medications  . atorvastatin (LIPITOR) 20 MG tablet    Sig: Take 1 tablet (20 mg total) by mouth daily.    Dispense:  90 tablet    Refill:  3    Follow-up: No follow-ups on file.    Carolann Littler, MD

## 2020-09-13 ENCOUNTER — Telehealth: Payer: Self-pay

## 2020-09-13 LAB — BASIC METABOLIC PANEL
BUN: 16 mg/dL (ref 7–25)
CO2: 26 mmol/L (ref 20–32)
Calcium: 9.6 mg/dL (ref 8.6–10.4)
Chloride: 104 mmol/L (ref 98–110)
Creat: 0.68 mg/dL (ref 0.50–1.05)
Glucose, Bld: 113 mg/dL — ABNORMAL HIGH (ref 65–99)
Potassium: 4.5 mmol/L (ref 3.5–5.3)
Sodium: 140 mmol/L (ref 135–146)

## 2020-09-13 LAB — CBC WITH DIFFERENTIAL/PLATELET
Absolute Monocytes: 335 cells/uL (ref 200–950)
Basophils Absolute: 59 cells/uL (ref 0–200)
Basophils Relative: 1.1 %
Eosinophils Absolute: 130 cells/uL (ref 15–500)
Eosinophils Relative: 2.4 %
HCT: 45.4 % — ABNORMAL HIGH (ref 35.0–45.0)
Hemoglobin: 14.9 g/dL (ref 11.7–15.5)
Lymphs Abs: 1858 cells/uL (ref 850–3900)
MCH: 29.4 pg (ref 27.0–33.0)
MCHC: 32.8 g/dL (ref 32.0–36.0)
MCV: 89.7 fL (ref 80.0–100.0)
MPV: 9.2 fL (ref 7.5–12.5)
Monocytes Relative: 6.2 %
Neutro Abs: 3019 cells/uL (ref 1500–7800)
Neutrophils Relative %: 55.9 %
Platelets: 377 10*3/uL (ref 140–400)
RBC: 5.06 10*6/uL (ref 3.80–5.10)
RDW: 13 % (ref 11.0–15.0)
Total Lymphocyte: 34.4 %
WBC: 5.4 10*3/uL (ref 3.8–10.8)

## 2020-09-13 LAB — LIPID PANEL
Cholesterol: 239 mg/dL — ABNORMAL HIGH (ref <200)
HDL: 73 mg/dL (ref >=50)
LDL Cholesterol (Calc): 138 mg/dL (calc) — ABNORMAL HIGH
Non-HDL Cholesterol (Calc): 166 mg/dL (calc) — ABNORMAL HIGH (ref <130)
Total CHOL/HDL Ratio: 3.3 (calc) (ref <5.0)
Triglycerides: 151 mg/dL — ABNORMAL HIGH (ref <150)

## 2020-09-13 LAB — HEPATIC FUNCTION PANEL
AG Ratio: 1.6 (calc) (ref 1.0–2.5)
ALT: 28 U/L (ref 6–29)
AST: 20 U/L (ref 10–35)
Albumin: 4.6 g/dL (ref 3.6–5.1)
Alkaline phosphatase (APISO): 73 U/L (ref 37–153)
Bilirubin, Direct: 0.1 mg/dL (ref 0.0–0.2)
Globulin: 2.8 g/dL (calc) (ref 1.9–3.7)
Indirect Bilirubin: 0.5 mg/dL (calc) (ref 0.2–1.2)
Total Bilirubin: 0.6 mg/dL (ref 0.2–1.2)
Total Protein: 7.4 g/dL (ref 6.1–8.1)

## 2020-09-13 LAB — HEMOGLOBIN A1C
Hgb A1c MFr Bld: 6.3 % of total Hgb — ABNORMAL HIGH (ref <5.7)
Mean Plasma Glucose: 134 mg/dL
eAG (mmol/L): 7.4 mmol/L

## 2020-09-13 LAB — TSH: TSH: 1.48 mIU/L (ref 0.40–4.50)

## 2020-09-13 MED ORDER — ATORVASTATIN CALCIUM 40 MG PO TABS: 40.0000 mg | ORAL_TABLET | Freq: Every day | ORAL | 3 refills | Status: DC

## 2020-09-13 NOTE — Telephone Encounter (Signed)
Patient aware of results and recommendations. °

## 2020-09-13 NOTE — Telephone Encounter (Signed)
-----   Message from Eulas Post, MD sent at 09/13/2020 12:42 PM EST ----- A1c remains stable at 6.3%.  Labs all look good except for elevated cholesterol of 239 with LDL 138.  Would increase Lipitor to 40 mg once daily-currently on 20 mg. Continue low glycemic and low saturated fat diet

## 2020-09-16 ENCOUNTER — Telehealth: Payer: Self-pay | Admitting: Family Medicine

## 2020-09-16 MED ORDER — ATORVASTATIN CALCIUM 20 MG PO TABS: 40.0000 mg | ORAL_TABLET | Freq: Every day | ORAL | 3 refills | Status: DC

## 2020-09-16 NOTE — Telephone Encounter (Signed)
Okay to change? 

## 2020-09-16 NOTE — Telephone Encounter (Signed)
RX changed and sent per Dr Elease Hashimoto

## 2020-09-16 NOTE — Telephone Encounter (Signed)
Pts spouse is calling in stating that if Dr. Elease Hashimoto would write a new Rx for atorvastatin (LIPITOR) 40 MG to change it to 2 pills a day with the MG being changed to 20 MG so that the pt does not have to pay anything for this medication.  Pharm:  [facility] [address] in [place] (P) [phone] (F)  Pt would like to have this changed so that they are able to save money.

## 2020-09-17 NOTE — Telephone Encounter (Signed)
Agree with change from 40 mg daily to taking two 20 mg tabs daily (for cost savings).

## 2021-03-27 ENCOUNTER — Ambulatory Visit: Payer: 59 | Admitting: Family Medicine

## 2021-03-27 ENCOUNTER — Encounter: Payer: Self-pay | Admitting: Family Medicine

## 2021-03-27 ENCOUNTER — Other Ambulatory Visit: Payer: Self-pay

## 2021-03-27 VITALS — BP 120/62 | HR 58 | Temp 97.9°F | Wt 124.7 lb

## 2021-03-27 DIAGNOSIS — E785 Hyperlipidemia, unspecified: Secondary | ICD-10-CM | POA: Diagnosis not present

## 2021-03-27 DIAGNOSIS — R002 Palpitations: Secondary | ICD-10-CM

## 2021-03-27 DIAGNOSIS — Z8249 Family history of ischemic heart disease and other diseases of the circulatory system: Secondary | ICD-10-CM | POA: Diagnosis not present

## 2021-03-27 DIAGNOSIS — R739 Hyperglycemia, unspecified: Secondary | ICD-10-CM | POA: Diagnosis not present

## 2021-03-27 LAB — LIPID PANEL
Cholesterol: 190 mg/dL (ref 0–200)
HDL: 62.6 mg/dL (ref >39.00)
LDL Cholesterol: 109 mg/dL — ABNORMAL HIGH (ref 0–99)
NonHDL: 126.96
Total CHOL/HDL Ratio: 3
Triglycerides: 90 mg/dL (ref 0.0–149.0)
VLDL: 18 mg/dL (ref 0.0–40.0)

## 2021-03-27 LAB — HEPATIC FUNCTION PANEL
ALT: 25 U/L (ref 0–35)
AST: 19 U/L (ref 0–37)
Albumin: 4.6 g/dL (ref 3.5–5.2)
Alkaline Phosphatase: 70 U/L (ref 39–117)
Bilirubin, Direct: 0.1 mg/dL (ref 0.0–0.3)
Total Bilirubin: 0.5 mg/dL (ref 0.2–1.2)
Total Protein: 7.3 g/dL (ref 6.0–8.3)

## 2021-03-27 LAB — HEMOGLOBIN A1C: Hgb A1c MFr Bld: 6.7 % — ABNORMAL HIGH (ref 4.6–6.5)

## 2021-03-27 NOTE — Progress Notes (Signed)
Established Patient Office Visit  Subjective:  Patient ID: Shelley Rhodes, female    DOB: [birth year]  Age: 60 y.o. MRN: [id number]  CC:  Chief Complaint  Patient presents with   Follow-up    HPI Shelley Rhodes presents for follow-up regarding hyperlipidemia.  She had labs back in December with elevated LDL cholesterol 139 on Lipitor 20 mg.  We increased her Lipitor to 40 mg daily and she is tolerating well with no side effects.  She states that she has 2 brothers who live in [place] who have been diagnosed with CAD in the past year.  One required a bypass and one required a stent.  They had both been ex-smokers.  1 has history of diabetes.  She is very concerned about her family risk of heart disease and specifically would like to see a cardiologist.  Her father had a stroke age 64 and died age 39.  She relates some recent palpitations especially at night when lying in bed.  No syncope.  No presyncopal symptoms.  No chest pains.  No clear precipitating factors.  No regular alcohol use.  Past Medical History:  Diagnosis Date   Appendicitis    Arthritis    knee right    Constipation    occasionally not chronic - uses OTC stool softener    Fibroids    uterine fibroids   Hemorrhoids    History of heartburn    Hyperlipidemia    Hyperlipidemia    Nausea 2001   Right lower quadrant abdominal pain 2001    Past Surgical History:  Procedure Laterality Date   APPENDECTOMY  appendicitis   CESAREAN SECTION     x2   COLONOSCOPY     FINGER SURGERY     right 4th finger   POLYPECTOMY      Family History  Problem Relation Age of Onset   Cancer Mother        Lung   Lung cancer Mother    Stroke Father    Hypertension Father    Diabetes Father    Diabetes Brother    Colon cancer Neg Hx    Colon polyps Neg Hx    Esophageal cancer Neg Hx    Rectal cancer Neg Hx    Stomach cancer Neg Hx     Social History   Socioeconomic History   Marital status: Married    Spouse name: Not on file    Number of children: Not on file   Years of education: Not on file   Highest education level: Not on file  Occupational History   Not on file  Tobacco Use   Smoking status: Never   Smokeless tobacco: Never  Vaping Use   Vaping Use: Never used  Substance and Sexual Activity   Alcohol use: No   Drug use: No   Sexual activity: Not on file  Other Topics Concern   Not on file  Social History Narrative   Not on file   Social Determinants of Health   Financial Resource Strain: Not on file  Food Insecurity: Not on file  Transportation Needs: Not on file  Physical Activity: Not on file  Stress: Not on file  Social Connections: Not on file  Intimate Partner Violence: Not on file    Outpatient Medications Prior to Visit  Medication Sig Dispense Refill   atorvastatin (LIPITOR) 20 MG tablet Take 2 tablets (40 mg total) by mouth daily. 180 tablet 3   Cholecalciferol (VITAMIN D-3) 1000 UNITS CAPS  Take 2,000 Units by mouth daily.      No facility-administered medications prior to visit.    No Known Allergies  ROS Review of Systems  Constitutional:  Negative for appetite change, fatigue and unexpected weight change.  Eyes:  Negative for visual disturbance.  Respiratory:  Negative for cough, chest tightness, shortness of breath and wheezing.   Cardiovascular:  Positive for palpitations. Negative for chest pain and leg swelling.  Neurological:  Negative for dizziness, seizures, syncope, weakness, light-headedness and headaches.     Objective:    Physical Exam Constitutional:      Appearance: She is well-developed.  Eyes:     Pupils: Pupils are equal, round, and reactive to light.  Neck:     Thyroid: No thyromegaly.     Vascular: No JVD.  Cardiovascular:     Rate and Rhythm: Normal rate and regular rhythm.     Heart sounds:    No gallop.  Pulmonary:     Effort: Pulmonary effort is normal. No respiratory distress.     Breath sounds: Normal breath sounds. No wheezing or  rales.  Musculoskeletal:     Cervical back: Neck supple.  Neurological:     Mental Status: She is alert.    BP 120/62 (BP Location: Left Arm, Patient Position: Sitting, Cuff Size: Normal)   Pulse (!) 58   Temp 97.9 F (36.6 C) (Oral)   Wt 124 lb 11.2 oz (56.6 kg)   LMP 08/25/2013   SpO2 98%   BMI 22.44 kg/m  Wt Readings from Last 3 Encounters:  03/27/21 124 lb 11.2 oz (56.6 kg)  09/12/20 130 lb 8 oz (59.2 kg)  09/11/19 129 lb (58.5 kg)     Health Maintenance Due  Topic Date Due   HIV Screening  Never done   COVID-19 Vaccine (4 - Booster for Pfizer series) 02/27/2021    There are no preventive care reminders to display for this patient.  Lab Results  Component Value Date   TSH 1.48 09/12/2020   Lab Results  Component Value Date   WBC 5.4 09/12/2020   HGB 14.9 09/12/2020   HCT 45.4 (H) 09/12/2020   MCV 89.7 09/12/2020   PLT 377 09/12/2020   Lab Results  Component Value Date   NA 140 09/12/2020   K 4.5 09/12/2020   CO2 26 09/12/2020   GLUCOSE 113 (H) 09/12/2020   BUN 16 09/12/2020   CREATININE 0.68 09/12/2020   BILITOT 0.6 09/12/2020   ALKPHOS 80 09/11/2019   AST 20 09/12/2020   ALT 28 09/12/2020   PROT 7.4 09/12/2020   ALBUMIN 4.7 09/11/2019   CALCIUM 9.6 09/12/2020   GFR 93.89 09/11/2019   Lab Results  Component Value Date   CHOL 239 (H) 09/12/2020   Lab Results  Component Value Date   HDL 73 09/12/2020   Lab Results  Component Value Date   LDLCALC 138 (H) 09/12/2020   Lab Results  Component Value Date   TRIG 151 (H) 09/12/2020   Lab Results  Component Value Date   CHOLHDL 3.3 09/12/2020   Lab Results  Component Value Date   HGBA1C 6.3 (H) 09/12/2020      Assessment & Plan:   #1 history of hyperlipidemia.  Recent increase in Lipitor to 40 mg daily.  Patient tolerating well.  -Recheck lipid and hepatic panel  #2 positive family history of premature CAD.  Patient has 2 brothers with history of CAD.  She specifically would like  to see cardiology.  We talked about further risk stratification with things like coronary calcium score but she would like to go ahead directly to consult with cardiology.  We will set this up  #3 intermittent palpitations.  These occur more at night.  No clear provoking factors -Start with EKG-this shows sinus bradycardia with rate around 50 with no acute ST-T changes -Minimize caffeine use -She can discuss with cardiology further as above but may need event monitor if continues to have symptoms  #4 history of hyperglycemia.  Her A1c was slightly increased at 6.3% last December.  We will repeat today   No orders of the defined types were placed in this encounter.   Follow-up: No follow-ups on file.    Carolann Littler, MD

## 2021-06-06 ENCOUNTER — Telehealth: Payer: Self-pay

## 2021-06-06 NOTE — Telephone Encounter (Signed)
Labs have been printed. Spoke with he patient ans she asked that we mail these to her. Lab results have been mailed.

## 2021-06-06 NOTE — Telephone Encounter (Signed)
Patient called wanting a copy of lab results from her last visit. Pt will pick up when ready

## 2021-06-14 ENCOUNTER — Other Ambulatory Visit: Payer: Self-pay

## 2021-06-14 ENCOUNTER — Ambulatory Visit: Payer: 59 | Admitting: Internal Medicine

## 2021-06-14 ENCOUNTER — Encounter: Payer: Self-pay | Admitting: Internal Medicine

## 2021-06-14 VITALS — BP 120/70 | HR 47 | Ht 63.0 in | Wt 118.2 lb

## 2021-06-14 DIAGNOSIS — R002 Palpitations: Secondary | ICD-10-CM | POA: Diagnosis not present

## 2021-06-14 DIAGNOSIS — E119 Type 2 diabetes mellitus without complications: Secondary | ICD-10-CM

## 2021-06-14 DIAGNOSIS — E785 Hyperlipidemia, unspecified: Secondary | ICD-10-CM

## 2021-06-14 DIAGNOSIS — R072 Precordial pain: Secondary | ICD-10-CM | POA: Diagnosis not present

## 2021-06-14 DIAGNOSIS — Z79899 Other long term (current) drug therapy: Secondary | ICD-10-CM

## 2021-06-14 MED ORDER — ATORVASTATIN CALCIUM 20 MG PO TABS: 20.0000 mg | ORAL_TABLET | Freq: Every day | ORAL | 3 refills | Status: DC

## 2021-06-14 MED ORDER — EZETIMIBE 10 MG PO TABS: 10.0000 mg | ORAL_TABLET | Freq: Every day | ORAL | 3 refills | Status: DC

## 2021-06-14 NOTE — Progress Notes (Signed)
Cardiology Office Note:    Date:  06/14/2021   ID:  Shelley Rhodes, DOB [birth year], MRN [id number]  PCP:  Eulas Post, MD  Cardiologist:  None  Electrophysiologist:  None   Referring MD: Eulas Post, MD   Chief Complaint/Reason for Referral: Chest pain, palpitations  History of Present Illness:    Shelley Rhodes is a 60 y.o. female with a history of HLD, elevated HbA1C not currently on medication therapy.  She presents for evaluation of cardiovascular risk stratification in the setting of strong family history of CAD as noted below and possible cardiac chest pain.  Nonsmoker.  Feel heart beat irregular, 2-3 x a month, working in backyard.  She will experience a sharp pain in left side of chest, seconds at a time, she will go and rest and symptoms will improve.  The symptoms started 2 months ago, 2x in last few months. No chest pain with walking.  No significant dyspnea on exertion.  Last march younger brother underwent PCI after CCTA at age 46.  Smoker, diabetes. Brother 60 CABG smoker LPa high Father 52 stroke smoker, diabetes  The patient denies dyspnea at rest or with exertion, palpitations, PND, orthopnea, or leg swelling. Denies cough, fever, chills. Denies nausea, vomiting. Denies syncope or presyncope. Denies dizziness or lightheadedness. Denies snoring.   Past Medical History:  Diagnosis Date   Appendicitis    Arthritis    knee right    Constipation    occasionally not chronic - uses OTC stool softener    Fibroids    uterine fibroids   Hemorrhoids    History of heartburn    Hyperlipidemia    Hyperlipidemia    Nausea 2001   Right lower quadrant abdominal pain 2001    Past Surgical History:  Procedure Laterality Date   APPENDECTOMY  appendicitis   CESAREAN SECTION     x2   COLONOSCOPY     FINGER SURGERY     right 4th finger   POLYPECTOMY      Current Medications: Current Meds  Medication Sig   Cholecalciferol (VITAMIN D-3) 1000 UNITS CAPS Take 2,000  Units by mouth daily.    ezetimibe (ZETIA) 10 MG tablet Take 1 tablet (10 mg total) by mouth daily.   Magnesium 400 MG CAPS Take 400 mg by mouth as needed.   [DISCONTINUED] atorvastatin (LIPITOR) 20 MG tablet Take 2 tablets (40 mg total) by mouth daily.     Allergies:   Patient has no known allergies.   Social History   Tobacco Use   Smoking status: Never   Smokeless tobacco: Never  Vaping Use   Vaping Use: Never used  Substance Use Topics   Alcohol use: No   Drug use: No     Family History: The patient's family history includes Cancer in her mother; Diabetes in her brother and father; Hypertension in her father; Lung cancer in her mother; Stroke in her father. There is no history of Colon cancer, Colon polyps, Esophageal cancer, Rectal cancer, or Stomach cancer.  ROS:   Please see the history of present illness.    All other systems reviewed and are negative.  EKGs/Labs/Other Studies Reviewed:    The following studies were reviewed today:  EKG:  Sinus bradycardia possible LAE  Imaging studies that I have independently reviewed today: n/a  Recent Labs: 09/12/2020: BUN 16; Creat 0.68; Hemoglobin 14.9; Platelets 377; Potassium 4.5; Sodium 140; TSH 1.48 03/27/2021: ALT 25  Recent Lipid Panel    Component Value  Date/Time   CHOL 190 03/27/2021 0955   TRIG 90.0 03/27/2021 0955   HDL 62.60 03/27/2021 0955   CHOLHDL 3 03/27/2021 0955   VLDL 18.0 03/27/2021 0955   LDLCALC 109 (H) 03/27/2021 0955   LDLCALC 138 (H) 09/12/2020 1028   LDLDIRECT 135.9 06/23/2013 0859    Physical Exam:    VS:  BP 120/70 (BP Location: Left Arm, Patient Position: Sitting, Cuff Size: Normal)   Pulse (!) 47   Ht '5\' 3"'$  (1.6 m)   Wt 118 lb 3.2 oz (53.6 kg)   LMP 08/25/2013   SpO2 99%   BMI 20.94 kg/m     Wt Readings from Last 5 Encounters:  06/14/21 118 lb 3.2 oz (53.6 kg)  03/27/21 124 lb 11.2 oz (56.6 kg)  09/12/20 130 lb 8 oz (59.2 kg)  09/11/19 129 lb (58.5 kg)  10/10/18 126 lb (57.2  kg)    Constitutional: No acute distress Eyes: sclera non-icteric, normal conjunctiva and lids ENMT: normal dentition, moist mucous membranes Cardiovascular: regular rhythm, normal rate, no murmurs. S1 and S2 normal. Radial pulses normal bilaterally. No jugular venous distention.  Respiratory: clear to auscultation bilaterally GI : normal bowel sounds, soft and nontender. No distention.   MSK: extremities warm, well perfused. No edema.  NEURO: grossly nonfocal exam, moves all extremities. PSYCH: alert and oriented x 3, normal mood and affect.   ASSESSMENT:    1. Precordial pain   2. Hyperlipidemia, unspecified hyperlipidemia type   3. Palpitations   4. Type 2 diabetes mellitus without complication, without long-term current use of insulin ([place])   5. Medication management    PLAN:    Precordial pain - Plan: EKG 12-Lead, CT CORONARY MORPH W/CTA COR W/SCORE W/CA W/CM &/OR WO/CM, Basic metabolic panel -She is quite concerned about her strong family history of coronary artery disease and atypical episodes of chest pain and is concerned about obstructive CAD.  We discussed the risks and benefits of CT coronary angiography, patient and her husband would feel greatly reassured by completing this test.  Heart rate is 47 beats a minute, she would be an optimal candidate for very low-dose radiation protocol FLASH scan.  Hyperlipidemia, unspecified hyperlipidemia type Medication management -She is experiencing myalgias since increasing the dose of her atorvastatin in December of last year.  She would like to reduce the dose and is curious about using ezetimibe.  Reasonable to try reduced dose of atorvastatin with ezetimibe for lipid control, LDL 109 when last checked in [place].  Repeat lipids with PCP otherwise we will complete these at her next follow-up.  If CAD on CCTA, consider PCSK9 inhibitor.  Palpitations-she will monitor these closely, no change to medication therapy today.  Type 2 diabetes  mellitus without complication, without long-term current use of insulin ([place]) -Discussed in detail the merits of metformin versus SGLT2 inhibitor.  She will discuss this with her primary care physician and likely initiate treatment after her next hemoglobin A1c if still elevated.  Total time of encounter: 60 minutes total time of encounter, including 35 minutes spent in face-to-face patient care on the date of this encounter. This time includes coordination of care and counseling regarding above mentioned problem list. Remainder of non-face-to-face time involved reviewing chart documents/testing relevant to the patient encounter and documentation in the medical record. I have independently reviewed documentation from referring provider.   Cherlynn Kaiser, MD, [place]   Shared Decision Making/Informed Consent:       Medication Adjustments/Labs  and Tests Ordered: Current medicines are reviewed at length with the patient today.  Concerns regarding medicines are outlined above.   Orders Placed This Encounter  Procedures   CT CORONARY MORPH W/CTA COR W/SCORE W/CA W/CM &/OR WO/CM   Basic metabolic panel   EKG [id number]    Meds ordered this encounter  Medications   atorvastatin (LIPITOR) 20 MG tablet    Sig: Take 1 tablet (20 mg total) by mouth daily.    Dispense:  90 tablet    Refill:  3   ezetimibe (ZETIA) 10 MG tablet    Sig: Take 1 tablet (10 mg total) by mouth daily.    Dispense:  90 tablet    Refill:  3    Patient Instructions  Medication Instructions:  START: ZETIA- '10mg'$  DAILY  DECREASE: ATORVASTATIN TO '20mg'$  DAILY  *If you need a refill on your cardiac medications before your next appointment, please call your pharmacy*  Lab Work: [facility]- [place]  If you have labs (blood work) drawn today and your tests are completely normal, you will receive your results only by: [place] (if you have MyChart) OR A paper copy in the  mail If you have any lab test that is abnormal or we need to change your treatment, we will call you to review the results.  Testing/Procedures: Your physician has requested that you have cardiac CT. Cardiac computed tomography (CT) is a painless test that uses an x-ray machine to take clear, detailed pictures of your heart. For further information please visit [facility]. Please follow instruction sheet as given.  Follow-Up: At [facility], you and your health needs are our priority.  As part of our continuing mission to provide you with exceptional heart care, we have created designated Provider Care Teams.  These Care Teams include your primary Cardiologist (physician) and Advanced Practice Providers (APPs -  Physician Assistants and Nurse Practitioners) who all work together to provide you with the care you need, when you need it.  Your next appointment:   1 month(s)  The format for your next appointment:   In Person  Provider:   Cherlynn Kaiser, MD  Other Instructions   Your cardiac CT will be scheduled at one of the below locations:   [facility] [address] [place],  [address] [phone]  If scheduled at [facility], please arrive at the [facility] main entrance (entrance A) of [facility] 30 minutes prior to test start time. Proceed to the [facility] Radiology Department (first floor) to check-in and test prep.  Please follow these instructions carefully (unless otherwise directed):  On the Night Before the Test: Be sure to Drink plenty of water. Do not consume any caffeinated/decaffeinated beverages or chocolate 12 hours prior to your test. Do not take any antihistamines 12 hours prior to your test.  On the Day of the Test: Drink plenty of water until 1 hour prior to the test. Do not eat any food 4 hours prior to the test. You may take your regular medications prior to the test.  Take metoprolol (Lopressor) two  hours prior to test. HOLD Furosemide/Hydrochlorothiazide morning of the test. FEMALES- please wear underwire-free bra if available, avoid dresses & tight clothing  After the Test: Drink plenty of water. After receiving IV contrast, you may experience a mild flushed feeling. This is normal. On occasion, you may experience a mild rash up to 24 hours after the test. This is not  dangerous. If this occurs, you can take Benadryl 25 mg and increase your fluid intake. If you experience trouble breathing, this can be serious. If it is severe call 911 IMMEDIATELY. If it is mild, please call our office. If you take any of these medications: Glipizide/Metformin, Avandament, Glucavance, please do not take 48 hours after completing test unless otherwise instructed.  Please allow 2-4 weeks for scheduling of routine cardiac CTs. Some insurance companies require a pre-authorization which may delay scheduling of this test.   For non-scheduling related questions, please contact the cardiac imaging nurse navigator should you have any questions/concerns: Marchia Bond, Cardiac Imaging Nurse Navigator Gordy Clement, Cardiac Imaging Nurse Navigator [facility] Direct Office Dial: [phone]   For scheduling needs, including cancellations and rescheduling, please call [place], [phone].

## 2021-06-14 NOTE — Patient Instructions (Signed)
Medication Instructions:  START: ZETIA- '10mg'$  DAILY  DECREASE: ATORVASTATIN TO '20mg'$  DAILY  *If you need a refill on your cardiac medications before your next appointment, please call your pharmacy*  Lab Work: [facility]- [place]  If you have labs (blood work) drawn today and your tests are completely normal, you will receive your results only by: MyChart Message (if you have MyChart) OR A paper copy in the mail If you have any lab test that is abnormal or we need to change your treatment, we will call you to review the results.  Testing/Procedures: Your physician has requested that you have cardiac CT. Cardiac computed tomography (CT) is a painless test that uses an x-ray machine to take clear, detailed pictures of your heart. For further information please visit [facility]. Please follow instruction sheet as given.  Follow-Up: At [facility], you and your health needs are our priority.  As part of our continuing mission to provide you with exceptional heart care, we have created designated Provider Care Teams.  These Care Teams include your primary Cardiologist (physician) and Advanced Practice Providers (APPs -  Physician Assistants and Nurse Practitioners) who all work together to provide you with the care you need, when you need it.  Your next appointment:   1 month(s)  The format for your next appointment:   In Person  Provider:   Cherlynn Kaiser, MD  Other Instructions   Your cardiac CT will be scheduled at one of the below locations:   [facility] [address] [place], [place] [address] [phone]  If scheduled at [facility], please arrive at the [facility] main entrance (entrance A) of [facility] 30 minutes prior to test start time. Proceed to the [facility] Radiology Department (first floor) to check-in and test prep.  Please follow these instructions carefully (unless otherwise directed):  On the Night  Before the Test: Be sure to Drink plenty of water. Do not consume any caffeinated/decaffeinated beverages or chocolate 12 hours prior to your test. Do not take any antihistamines 12 hours prior to your test.  On the Day of the Test: Drink plenty of water until 1 hour prior to the test. Do not eat any food 4 hours prior to the test. You may take your regular medications prior to the test.  Take metoprolol (Lopressor) two hours prior to test. HOLD Furosemide/Hydrochlorothiazide morning of the test. FEMALES- please wear underwire-free bra if available, avoid dresses & tight clothing  After the Test: Drink plenty of water. After receiving IV contrast, you may experience a mild flushed feeling. This is normal. On occasion, you may experience a mild rash up to 24 hours after the test. This is not dangerous. If this occurs, you can take Benadryl 25 mg and increase your fluid intake. If you experience trouble breathing, this can be serious. If it is severe call 911 IMMEDIATELY. If it is mild, please call our office. If you take any of these medications: Glipizide/Metformin, Avandament, Glucavance, please do not take 48 hours after completing test unless otherwise instructed.  Please allow 2-4 weeks for scheduling of routine cardiac CTs. Some insurance companies require a pre-authorization which may delay scheduling of this test.   For non-scheduling related questions, please contact the cardiac imaging nurse navigator should you have any questions/concerns: Marchia Bond, Cardiac Imaging Nurse Navigator Gordy Clement, Cardiac Imaging Nurse Navigator [facility] Direct Office Dial: [phone]   For scheduling needs, including  cancellations and rescheduling, please call [place], [phone].

## 2021-06-15 LAB — BASIC METABOLIC PANEL
BUN/Creatinine Ratio: 25 — ABNORMAL HIGH (ref 9–23)
BUN: 16 mg/dL (ref 6–24)
CO2: 23 mmol/L (ref 20–29)
Calcium: 9.1 mg/dL (ref 8.7–10.2)
Chloride: 104 mmol/L (ref 96–106)
Creatinine, Ser: 0.63 mg/dL (ref 0.57–1.00)
Glucose: 109 mg/dL — ABNORMAL HIGH (ref 65–99)
Potassium: 4.3 mmol/L (ref 3.5–5.2)
Sodium: 142 mmol/L (ref 134–144)
eGFR: 103 mL/min/{1.73_m2} (ref >59)

## 2021-06-16 ENCOUNTER — Telehealth (HOSPITAL_COMMUNITY): Payer: Self-pay | Admitting: Emergency Medicine

## 2021-06-16 NOTE — Telephone Encounter (Signed)
Attempted to call patient regarding upcoming cardiac CT appointment. °Left message on voicemail with name and callback number °Fenna Semel RN Navigator Cardiac Imaging °[facility] °[phone] Office °[phone] Cell ° °

## 2021-06-20 ENCOUNTER — Other Ambulatory Visit: Payer: Self-pay

## 2021-06-20 ENCOUNTER — Ambulatory Visit (HOSPITAL_COMMUNITY)
Admission: RE | Admit: 2021-06-20 | Discharge: 2021-06-20 | Disposition: A | Discharge status: Home / Self Care | Payer: 59 | Source: Ambulatory Visit | Attending: Internal Medicine | Admitting: Internal Medicine

## 2021-06-20 DIAGNOSIS — R072 Precordial pain: Secondary | ICD-10-CM | POA: Diagnosis present

## 2021-06-20 MED ORDER — NITROGLYCERIN 0.4 MG SL SUBL
SUBLINGUAL_TABLET | SUBLINGUAL | Status: AC
  Filled 2021-06-20: qty 2

## 2021-06-20 MED ORDER — NITROGLYCERIN 0.4 MG SL SUBL
0.8000 mg | SUBLINGUAL_TABLET | Freq: Once | SUBLINGUAL | Status: AC
  Administered 2021-06-20: 0.8 mg via SUBLINGUAL

## 2021-06-20 MED ORDER — IOHEXOL 350 MG/ML SOLN
95.0000 mL | Freq: Once | INTRAVENOUS | Status: AC | PRN
  Administered 2021-06-20: 95 mL via INTRAVENOUS

## 2021-06-20 NOTE — Progress Notes (Signed)
CT scan completed. Tolerated well. D/c home ambulatory with husband. Awake and alert. In no distress

## 2021-06-23 ENCOUNTER — Other Ambulatory Visit: Payer: Self-pay

## 2021-06-23 DIAGNOSIS — IMO0001 Reserved for inherently not codable concepts without codable children: Secondary | ICD-10-CM

## 2021-06-23 DIAGNOSIS — R911 Solitary pulmonary nodule: Secondary | ICD-10-CM

## 2021-07-26 NOTE — Progress Notes (Signed)
Cardiology Office Note:    Date:  07/27/2021   ID:  Shelley Rhodes, DOB Sep 06, 1961, MRN [id number]  PCP:  Eulas Post, MD  Cardiologist:  None  Electrophysiologist:  None   Referring MD: Eulas Post, MD   Chief Complaint/Reason for Referral: Chest pain, palpitations  History of Present Illness:    Shelley Rhodes is a 60 y.o. female with a history of HLD, elevated HbA1C not currently on medication therapy, here for follow-up. In 06/2021 she initially presented for evaluation of cardiovascular risk stratification in the setting of strong family history of CAD as noted below and possible cardiac chest pain.  She is accompanied by her husband. Overall, she is feeling "not too bad." She has been concerned about the finding of a nodule on her right lung during her cardiac CTA 06/20/2021. We discussed her results, stressing that the nodule is very small and likely low-risk. Of note, her mother had lung cancer that was thought to be due to second-hand smoke exposure. Given the patients family history, can repeat CT chest in 1 year per [facility] guidelines. Patient would like a referral to the [facility]. I would like her to discuss this with her PMD.  Recently we started ezetimibe, and continues to take 20 mg atorvastatin. Labs due today. Less myalgias.  The patient denies chest pain, chest pressure, dyspnea at rest or with exertion, palpitations, PND, orthopnea, or leg swelling. Denies cough, fever, chills. Denies nausea, vomiting. Denies syncope or presyncope. Denies dizziness or lightheadedness. Denies snoring.    Past Medical History:  Diagnosis Date   Appendicitis    Arthritis    knee right    Constipation    occasionally not chronic - uses OTC stool softener    Fibroids    uterine fibroids   Hemorrhoids    History of heartburn    Hyperlipidemia    Hyperlipidemia    Nausea 2001   Right lower quadrant abdominal pain 2001    Past Surgical History:  Procedure Laterality  Date   APPENDECTOMY  appendicitis   CESAREAN SECTION     x2   COLONOSCOPY     FINGER SURGERY     right 4th finger   POLYPECTOMY      Current Medications: Current Meds  Medication Sig   atorvastatin (LIPITOR) 20 MG tablet Take 1 tablet (20 mg total) by mouth daily.   Cholecalciferol (VITAMIN D-3) 1000 UNITS CAPS Take 2,000 Units by mouth daily.    ezetimibe (ZETIA) 10 MG tablet Take 1 tablet (10 mg total) by mouth daily.   Magnesium 400 MG CAPS Take 400 mg by mouth as needed.     Allergies:   Patient has no known allergies.   Social History   Tobacco Use   Smoking status: Never   Smokeless tobacco: Never  Vaping Use   Vaping Use: Never used  Substance Use Topics   Alcohol use: No   Drug use: No     Family History: The patient's family history includes Cancer in her mother; Diabetes in her brother and father; Hypertension in her father; Lung cancer in her mother; Stroke in her father. There is no history of Colon cancer, Colon polyps, Esophageal cancer, Rectal cancer, or Stomach cancer.  ROS:   Please see the history of present illness.    All other systems reviewed and are negative.  EKGs/Labs/Other Studies Reviewed:    The following studies were reviewed today:  Cardiac CTA with Calcium Score 06/20/2021: FINDINGS: IMPRESSION: 1.  Coronary calcium score of 0. This was 0 percentile for age-, sex, and race-matched controls.   2. Normal coronary origin with right dominance.   3. No evidence of CAD.   RECOMMENDATIONS: CAD-RADS 0: No evidence of CAD (0%). Consider non-atherosclerotic causes of chest pain.  EXAM: OVER-READ INTERPRETATION  CT CHEST   IMPRESSION: 5 mm right lower lobe pulmonary nodule. No follow-up needed if patient is low-risk. Non-contrast chest CT can be considered in 12 months if patient is high-risk. This recommendation follows the consensus statement: Guidelines for Management of Incidental Pulmonary Nodules Detected on CT Images: From the  Fleischner Society 2017; Radiology 2017; 284:228-243.  EKG:   07/27/2021: Sinus bradycardia. Rate 51 bpm. 06/14/2021: Sinus bradycardia possible LAE  Imaging studies that I have independently reviewed today: n/a  Recent Labs: 09/12/2020: Hemoglobin 14.9; Platelets 377; TSH 1.48 03/27/2021: ALT 25 06/14/2021: BUN 16; Creatinine, Ser 0.63; Potassium 4.3; Sodium 142  Recent Lipid Panel    Component Value Date/Time   CHOL 190 03/27/2021 0955   TRIG 90.0 03/27/2021 0955   HDL 62.60 03/27/2021 0955   CHOLHDL 3 03/27/2021 0955   VLDL 18.0 03/27/2021 0955   LDLCALC 109 (H) 03/27/2021 0955   LDLCALC 138 (H) 09/12/2020 1028   LDLDIRECT 135.9 06/23/2013 0859    Physical Exam:    VS:  BP 109/60   Pulse (!) 51   Ht 5\' 3"  (1.6 m)   Wt 117 lb 3.2 oz (53.2 kg)   LMP 08/25/2013   SpO2 99%   BMI 20.76 kg/m     Wt Readings from Last 5 Encounters:  07/27/21 117 lb 3.2 oz (53.2 kg)  06/14/21 118 lb 3.2 oz (53.6 kg)  03/27/21 124 lb 11.2 oz (56.6 kg)  09/12/20 130 lb 8 oz (59.2 kg)  09/11/19 129 lb (58.5 kg)    Constitutional: No acute distress Eyes: sclera non-icteric, normal conjunctiva and lids ENMT: normal dentition, moist mucous membranes Cardiovascular: regular rhythm, normal rate, no murmurs. S1 and S2 normal. Radial pulses normal bilaterally. No jugular venous distention.  Respiratory: clear to auscultation bilaterally GI : normal bowel sounds, soft and nontender. No distention.   MSK: extremities warm, well perfused. No edema.  NEURO: grossly nonfocal exam, moves all extremities. PSYCH: alert and oriented x 3, normal mood and affect.   ASSESSMENT:    1. Precordial pain   2. Hyperlipidemia, unspecified hyperlipidemia type   3. Lung nodule   4. Medication management   5. Type 2 diabetes mellitus without complication, without long-term current use of insulin (HCC)     PLAN:    Precordial pain - - no significant cp since last visit. CCTA normal.  Hyperlipidemia,  unspecified hyperlipidemia type Medication management - lipids due today. Tolerating atorvastatin 20 mg daily and zetia 10 mg daily well.  Type 2 diabetes mellitus without complication, without long-term current use of insulin (HCC) -Hba1c today.   Lung nodule - Repeat chest CT in 1 year. This has been ordered and confirmed.  Follow-up PRN per patient preference.  Total time of encounter: 30 minutes total time of encounter, including 25 minutes spent in face-to-face patient care on the date of this encounter. This time includes coordination of care and counseling regarding above mentioned problem list. Remainder of non-face-to-face time involved reviewing chart documents/testing relevant to the patient encounter and documentation in the medical record. I have independently reviewed documentation from referring provider.   Cherlynn Kaiser, MD, [place]   Shared Decision Making/Informed Consent:  Medication Adjustments/Labs and Tests Ordered: Current medicines are reviewed at length with the patient today.  Concerns regarding medicines are outlined above.   Orders Placed This Encounter  Procedures   Lipid panel   Hemoglobin A1c   EKG 12-Lead     No orders of the defined types were placed in this encounter.   Patient Instructions  Medication Instructions:  CONTINUE WITH SAME MEDICATIONS *If you need a refill on your cardiac medications before your next appointment, please call your pharmacy*   Lab Work: TODAY If you have labs (blood work) drawn today and your tests are completely normal, you will receive your results only by: [place] (if you have MyChart) OR A paper copy in the mail If you have any lab test that is abnormal or we need to change your treatment, we will call you to review the results.   Testing/Procedures: NONE ORDERED   Follow-Up: At [facility], you and your health needs are our priority.  As part of our  continuing mission to provide you with exceptional heart care, we have created designated Provider Care Teams.  These Care Teams include your primary Cardiologist (physician) and Advanced Practice Providers (APPs -  Physician Assistants and Nurse Practitioners) who all work together to provide you with the care you need, when you need it.  We recommend signing up for the patient portal called "MyChart".  Sign up information is provided on this After Visit Summary.  MyChart is used to connect with patients for Virtual Visits (Telemedicine).  Patients are able to view lab/test results, encounter notes, upcoming appointments, etc.  Non-urgent messages can be sent to your provider as well.   To learn more about what you can do with MyChart, go to [email or web address].    Your next appointment:   AS NEEDED   The format for your next appointment:   In Person  Provider:   Cherlynn Kaiser, MD       [facility],acting as a scribe for Elouise Munroe, MD.,have documented all relevant documentation on the behalf of Elouise Munroe, MD,as directed by  Elouise Munroe, MD while in the presence of Elouise Munroe, MD.  I, Elouise Munroe, MD, have reviewed all documentation for this visit. The documentation on today's date of service for the exam, diagnosis, procedures, and orders are all accurate and complete.

## 2021-07-27 ENCOUNTER — Other Ambulatory Visit: Payer: Self-pay

## 2021-07-27 ENCOUNTER — Ambulatory Visit: Payer: 59 | Admitting: Internal Medicine

## 2021-07-27 VITALS — BP 109/60 | HR 51 | Ht 63.0 in | Wt 117.2 lb

## 2021-07-27 DIAGNOSIS — E119 Type 2 diabetes mellitus without complications: Secondary | ICD-10-CM

## 2021-07-27 DIAGNOSIS — Z79899 Other long term (current) drug therapy: Secondary | ICD-10-CM

## 2021-07-27 DIAGNOSIS — R911 Solitary pulmonary nodule: Secondary | ICD-10-CM | POA: Diagnosis not present

## 2021-07-27 DIAGNOSIS — E785 Hyperlipidemia, unspecified: Secondary | ICD-10-CM

## 2021-07-27 DIAGNOSIS — R072 Precordial pain: Secondary | ICD-10-CM | POA: Diagnosis not present

## 2021-07-27 LAB — LIPID PANEL
Chol/HDL Ratio: 2.5 ratio (ref 0.0–4.4)
Cholesterol, Total: 179 mg/dL (ref 100–199)
HDL: 73 mg/dL (ref >39)
LDL Chol Calc (NIH): 94 mg/dL (ref 0–99)
Triglycerides: 64 mg/dL (ref 0–149)
VLDL Cholesterol Cal: 12 mg/dL (ref 5–40)

## 2021-07-27 LAB — HEMOGLOBIN A1C
Est. average glucose Bld gHb Est-mCnc: 128 mg/dL
Hgb A1c MFr Bld: 6.1 % — ABNORMAL HIGH (ref 4.8–5.6)

## 2021-07-27 NOTE — Patient Instructions (Signed)
Medication Instructions:  CONTINUE WITH SAME MEDICATIONS *If you need a refill on your cardiac medications before your next appointment, please call your pharmacy*   Lab Work: TODAY If you have labs (blood work) drawn today and your tests are completely normal, you will receive your results only by: [place] (if you have MyChart) OR A paper copy in the mail If you have any lab test that is abnormal or we need to change your treatment, we will call you to review the results.   Testing/Procedures: NONE ORDERED   Follow-Up: At [facility], you and your health needs are our priority.  As part of our continuing mission to provide you with exceptional heart care, we have created designated Provider Care Teams.  These Care Teams include your primary Cardiologist (physician) and Advanced Practice Providers (APPs -  Physician Assistants and Nurse Practitioners) who all work together to provide you with the care you need, when you need it.  We recommend signing up for the patient portal called "MyChart".  Sign up information is provided on this After Visit Summary.  MyChart is used to connect with patients for Virtual Visits (Telemedicine).  Patients are able to view lab/test results, encounter notes, upcoming appointments, etc.  Non-urgent messages can be sent to your provider as well.   To learn more about what you can do with MyChart, go to [email or web address].    Your next appointment:   AS NEEDED   The format for your next appointment:   In Person  Provider:   Cherlynn Kaiser, MD

## 2021-08-25 ENCOUNTER — Ambulatory Visit (INDEPENDENT_AMBULATORY_CARE_PROVIDER_SITE_OTHER): Payer: 59

## 2021-08-25 DIAGNOSIS — Z23 Encounter for immunization: Secondary | ICD-10-CM

## 2021-09-13 ENCOUNTER — Ambulatory Visit (INDEPENDENT_AMBULATORY_CARE_PROVIDER_SITE_OTHER): Payer: 59 | Admitting: Family Medicine

## 2021-09-13 ENCOUNTER — Other Ambulatory Visit: Payer: Self-pay

## 2021-09-13 ENCOUNTER — Encounter: Payer: Self-pay | Admitting: Family Medicine

## 2021-09-13 VITALS — BP 110/68 | HR 63 | Temp 97.5°F | Ht 63.0 in | Wt 114.8 lb

## 2021-09-13 DIAGNOSIS — E785 Hyperlipidemia, unspecified: Secondary | ICD-10-CM | POA: Diagnosis not present

## 2021-09-13 DIAGNOSIS — R748 Abnormal levels of other serum enzymes: Secondary | ICD-10-CM

## 2021-09-13 DIAGNOSIS — Z0000 Encounter for general adult medical examination without abnormal findings: Secondary | ICD-10-CM

## 2021-09-13 LAB — HEPATIC FUNCTION PANEL
ALT: 42 U/L — ABNORMAL HIGH (ref 0–35)
AST: 27 U/L (ref 0–37)
Albumin: 4.5 g/dL (ref 3.5–5.2)
Alkaline Phosphatase: 70 U/L (ref 39–117)
Bilirubin, Direct: 0.1 mg/dL (ref 0.0–0.3)
Total Bilirubin: 0.7 mg/dL (ref 0.2–1.2)
Total Protein: 7.2 g/dL (ref 6.0–8.3)

## 2021-09-13 LAB — LIPID PANEL
Cholesterol: 165 mg/dL (ref 0–200)
HDL: 71 mg/dL (ref >39.00)
LDL Cholesterol: 82 mg/dL (ref 0–99)
NonHDL: 94.23
Total CHOL/HDL Ratio: 2
Triglycerides: 63 mg/dL (ref 0.0–149.0)
VLDL: 12.6 mg/dL (ref 0.0–40.0)

## 2021-09-13 NOTE — Progress Notes (Signed)
Established Patient Office Visit  Subjective:  Patient ID: Shelley Rhodes, female    DOB: 08-04-1961  Age: 60 y.o. MRN: [id number]  CC:  Chief Complaint  Patient presents with   Annual Exam    HPI Shelley Rhodes presents for physical exam.  Shelley Rhodes has family history of CAD and history of hyperlipidemia.  Shelley Rhodes has seen cardiology recently.  Last year they obtained CT coronary studies which showed coronary calcium score of 0.  Shelley Rhodes did have incidental right lower lobe 5 mm nodule.  Patient was concerned because her mother died of small cell lung cancer at age 56.  Patient went recently to [facility] and had follow-up CT chest which showed no interval change in her nodule.  Patient is considering getting this rechecked again in a year.  Her recent LDL cholesterol was 94.  Shelley Rhodes is currently Lipitor 20 mg daily and they recently added Zetia 10 mg daily.  Tolerating well.  Shelley Rhodes had some fatigue issues with higher dose Lipitor.  Shelley Rhodes is requesting follow-up lipids now.  Health maintenance reviewed  -Shelley Rhodes sees gynecologist for mammograms and Pap smears -Shelley Rhodes has had flu vaccine already -Shingles vaccine already given -Colonoscopy due 2025 -Tetanus due 2031 -Prior hepatitis C screen negative  Social history-married.  Shelley Rhodes has a son who is ophthalmologist in [place].  Shelley Rhodes has daughter lives in [place].  Patient has never smoked.  No alcohol.  Family history-mother died age 55 of lung cancer.  Shelley Rhodes was a non-smoker.  Father had type 2 diabetes and stroke.  Shelley Rhodes has a brother with type 2 diabetes.  Both her brothers have history of CAD.  Past Medical History:  Diagnosis Date   Appendicitis    Arthritis    knee right    Constipation    occasionally not chronic - uses OTC stool softener    Fibroids    uterine fibroids   Hemorrhoids    History of heartburn    Hyperlipidemia    Hyperlipidemia    Nausea 2001   Right lower quadrant abdominal pain 2001    Past Surgical History:  Procedure Laterality Date    APPENDECTOMY  appendicitis   CESAREAN SECTION     x2   COLONOSCOPY     FINGER SURGERY     right 4th finger   POLYPECTOMY      Family History  Problem Relation Age of Onset   Cancer Mother        Lung   Lung cancer Mother    Stroke Father    Hypertension Father    Diabetes Father    Heart disease Brother    Diabetes Brother    Heart disease Brother    Colon cancer Neg Hx    Colon polyps Neg Hx    Esophageal cancer Neg Hx    Rectal cancer Neg Hx    Stomach cancer Neg Hx     Social History   Socioeconomic History   Marital status: Married    Spouse name: Not on file   Number of children: Not on file   Years of education: Not on file   Highest education level: Not on file  Occupational History   Not on file  Tobacco Use   Smoking status: Never   Smokeless tobacco: Never  Vaping Use   Vaping Use: Never used  Substance and Sexual Activity   Alcohol use: No   Drug use: No   Sexual activity: Not on file  Other Topics Concern   Not  on file  Social History Narrative   Not on file   Social Determinants of Health   Financial Resource Strain: Not on file  Food Insecurity: Not on file  Transportation Needs: Not on file  Physical Activity: Not on file  Stress: Not on file  Social Connections: Not on file  Intimate Partner Violence: Not on file    Outpatient Medications Prior to Visit  Medication Sig Dispense Refill   atorvastatin (LIPITOR) 20 MG tablet Take 1 tablet (20 mg total) by mouth daily. 90 tablet 3   Cholecalciferol (VITAMIN D-3) 1000 UNITS CAPS Take 2,000 Units by mouth daily.      ezetimibe (ZETIA) 10 MG tablet Take 1 tablet (10 mg total) by mouth daily. 90 tablet 3   Magnesium 400 MG CAPS Take 400 mg by mouth as needed.     No facility-administered medications prior to visit.    No Known Allergies  ROS Review of Systems  Constitutional:  Negative for activity change, appetite change, fatigue, fever and unexpected weight change.  HENT:   Negative for ear pain, hearing loss, sore throat and trouble swallowing.   Eyes:  Negative for visual disturbance.  Respiratory:  Negative for cough and shortness of breath.   Cardiovascular:  Negative for chest pain and palpitations.  Gastrointestinal:  Negative for abdominal pain, blood in stool, constipation and diarrhea.  Genitourinary:  Negative for dysuria and hematuria.  Musculoskeletal:  Negative for arthralgias, back pain and myalgias.  Skin:  Negative for rash.  Neurological:  Negative for dizziness, syncope and headaches.  Hematological:  Negative for adenopathy.  Psychiatric/Behavioral:  Negative for confusion and dysphoric mood.      Objective:    Physical Exam Constitutional:      Appearance: Shelley Rhodes is well-developed.  HENT:     Head: Normocephalic and atraumatic.  Eyes:     Pupils: Pupils are equal, round, and reactive to light.  Neck:     Thyroid: No thyromegaly.  Cardiovascular:     Rate and Rhythm: Normal rate and regular rhythm.     Heart sounds: Normal heart sounds. No murmur heard. Pulmonary:     Effort: No respiratory distress.     Breath sounds: Normal breath sounds. No wheezing or rales.  Abdominal:     General: Bowel sounds are normal. There is no distension.     Palpations: Abdomen is soft. There is no mass.     Tenderness: There is no abdominal tenderness. There is no guarding or rebound.  Musculoskeletal:        General: Normal range of motion.     Cervical back: Normal range of motion and neck supple.  Lymphadenopathy:     Cervical: No cervical adenopathy.  Skin:    Findings: No rash.  Neurological:     Mental Status: Shelley Rhodes is alert and oriented to person, place, and time.     Cranial Nerves: No cranial nerve deficit.  Psychiatric:        Behavior: Behavior normal.        Thought Content: Thought content normal.        Judgment: Judgment normal.    BP 110/68 (BP Location: Left Arm, Patient Position: Sitting, Cuff Size: Normal)   Pulse 63    Temp (!) 97.5 F (36.4 C) (Oral)   Ht _0  (1.6 m)   Wt 114 lb 12.8 oz (52.1 kg)   LMP 08/25/2013   SpO2 98%   BMI 20.34 kg/m  Wt Readings from Last 3 Encounters:  09/13/21 114 lb 12.8 oz (52.1 kg)  07/27/21 117 lb 3.2 oz (53.2 kg)  06/14/21 118 lb 3.2 oz (53.6 kg)     Health Maintenance Due  Topic Date Due   Pneumococcal Vaccine 66-71 Years old (1 - PCV) Never done   URINE MICROALBUMIN  Never done   HIV Screening  Never done   COVID-19 Vaccine (4 - Booster for Pfizer series) 12/25/2020   PAP SMEAR-Modifier  07/16/2021    There are no preventive care reminders to display for this patient.  Lab Results  Component Value Date   TSH 1.48 09/12/2020   Lab Results  Component Value Date   WBC 5.4 09/12/2020   HGB 14.9 09/12/2020   HCT 45.4 (H) 09/12/2020   MCV 89.7 09/12/2020   PLT 377 09/12/2020   Lab Results  Component Value Date   NA 142 06/14/2021   K 4.3 06/14/2021   CO2 23 06/14/2021   GLUCOSE 109 (H) 06/14/2021   BUN 16 06/14/2021   CREATININE 0.63 06/14/2021   BILITOT 0.5 03/27/2021   ALKPHOS 70 03/27/2021   AST 19 03/27/2021   ALT 25 03/27/2021   PROT 7.3 03/27/2021   ALBUMIN 4.6 03/27/2021   CALCIUM 9.1 06/14/2021   EGFR 103 06/14/2021   GFR 93.89 09/11/2019   Lab Results  Component Value Date   CHOL 179 07/27/2021   Lab Results  Component Value Date   HDL 73 07/27/2021   Lab Results  Component Value Date   LDLCALC 94 07/27/2021   Lab Results  Component Value Date   TRIG 64 07/27/2021   Lab Results  Component Value Date   CHOLHDL 2.5 07/27/2021   Lab Results  Component Value Date   HGBA1C 6.1 (H) 07/27/2021      Assessment & Plan:   Problem List Items Addressed This Visit       Unprioritized   Hyperlipidemia   Relevant Orders   Lipid panel   Hepatic function panel   Other Visit Diagnoses     Physical exam    -  Primary     Patient is generally very healthy.  Shelley Rhodes has strong family history of CAD but fortunately had  coronary calcium score of 0.  Shelley Rhodes is on antilipid therapy with recent addition of Zetia.  Incidental 5 mm right lower lobe lung nodule with follow-up CT showed no change  -Recheck lipid and hepatic panel -Continue regular weightbearing exercise -Shelley Rhodes has had some intermittent knee difficulties with knee pains and feels like Shelley Rhodes has some weakness from both knees.  We strongly suggest Shelley Rhodes add component of strengthening for her quadriceps such as cycling and Shelley Rhodes will look into this.  No orders of the defined types were placed in this encounter.   Follow-up: No follow-ups on file.    Carolann Littler, MD

## 2021-11-07 ENCOUNTER — Ambulatory Visit: Payer: 59 | Admitting: Internal Medicine

## 2021-11-07 ENCOUNTER — Encounter: Payer: Self-pay | Admitting: Internal Medicine

## 2021-11-07 VITALS — BP 112/80 | HR 64 | Ht 63.0 in | Wt 115.4 lb

## 2021-11-07 DIAGNOSIS — K5900 Constipation, unspecified: Secondary | ICD-10-CM | POA: Diagnosis not present

## 2021-11-07 DIAGNOSIS — Z86010 Personal history of colonic polyps: Secondary | ICD-10-CM | POA: Diagnosis not present

## 2021-11-07 DIAGNOSIS — K602 Anal fissure, unspecified: Secondary | ICD-10-CM

## 2021-11-07 MED ORDER — DILTIAZEM GEL 2 %: 1.0000 "application " | Freq: Four times a day (QID) | CUTANEOUS | 1 refills | Status: DC

## 2021-11-07 NOTE — Patient Instructions (Addendum)
If you are age 60 or older, your body mass index should be between 23-30. Your Body mass index is 20.44 kg/m. If this is out of the aforementioned range listed, please consider follow up with your Primary Care Provider.  If you are age 77 or younger, your body mass index should be between 19-25. Your Body mass index is 20.44 kg/m. If this is out of the aformentioned range listed, please consider follow up with your Primary Care Provider.   ________________________________________________________  The [facility] providers would like to encourage you to use [facility] to communicate with providers for non-urgent requests or questions.  Due to long hold times on the telephone, sending your provider a message by [facility] may be a faster and more efficient way to get a response.  Please allow 48 business hours for a response.  Please remember that this is for non-urgent requests.  _______________________________________________________  Your provider has prescribed Diltiazem gel for you. Please follow the directions written on your prescription bottle or given to you specifically by your provider. Since this is a specialty medication and is not readily available at most local pharmacies, we have sent your prescription to:  [facility] information is below: Address: [address] [place], [place] [address]  [phone]  *Please DO NOT go directly from our office to pick up this medication! Give the pharmacy 1 day to process the prescription as this is compounded and takes time to make.  Take 2 tablespoons of Metamucil in 14 ounces of juice or water daily  Take Sitz baths

## 2021-11-07 NOTE — Progress Notes (Signed)
HISTORY OF PRESENT ILLNESS:  Shelley Rhodes is a 61 y.o. female with a history of nonadvanced adenomatous colon polyps who presents today regarding rectal discomfort and a sensation of constipation.  We patient underwent index colonoscopy December 2014 and was found to have a nonadvanced adenoma.  The most recent colonoscopy was performed October 10, 2018.  The examination was normal.  Small internal hemorrhoids noted.  Follow-up in 5 years recommended.  Patient tells me that approximate 1 year ago she began to develop burning or itching discomfort in the anus.  She felt like she had some difficulty with bowel movements, then moves her bowels every day.  She has described problems with pain with defecation.  Some pinkish blood on the tissue.  She tells me that she was told that she has hemorrhoids and purchased an unspecified cream from [employer].  This did not help.  She saw her gynecologist who prescribed a hormone cream.  This did not help.  She saw a general surgeon who diagnosed her hemorrhoids but provided no therapy.  Finally, she saw a dermatologist who prescribed mupirocin ointment for more of a perianal skin condition.  This helped.  She tells me that she continues with discomfort with defecation.  Had this as recently as this morning.  She is worried about cancer.  Review of blood work from September through December 2022 shows no significant abnormalities.  No relevant radiology.  REVIEW OF SYSTEMS:  All non-GI ROS negative unless otherwise stated in the HPI.  Past Medical History:  Diagnosis Date   Appendicitis    Arthritis    knee right    Constipation    occasionally not chronic - uses OTC stool softener    Fibroids    uterine fibroids   Hemorrhoids    History of heartburn    Hyperlipidemia    Hyperlipidemia    Nausea 2001   Right lower quadrant abdominal pain 2001    Past Surgical History:  Procedure Laterality Date   APPENDECTOMY  appendicitis   CESAREAN SECTION     x2    COLONOSCOPY     FINGER SURGERY     right 4th finger   POLYPECTOMY      Social History [place]  reports that she has never smoked. She has never used smokeless tobacco. She reports that she does not drink alcohol and does not use drugs.  family history includes Cancer in her mother; Diabetes in her brother and father; Heart disease in her brother and brother; Hypertension in her father; Lung cancer in her mother; Stroke in her father.  No Known Allergies     PHYSICAL EXAMINATION: Vital signs: BP 112/80    Pulse 64    Ht 5\' 3"  (1.6 m)    Wt 115 lb 6 oz (52.3 kg)    LMP 08/25/2013    SpO2 99%    BMI 20.44 kg/m   Constitutional: generally well-appearing, no acute distress Psychiatric: alert and oriented x3, cooperative Eyes: extraocular movements intact, anicteric, conjunctiva pink Mouth: Mask Neck: supple no lymphadenopathy Cardiovascular: heart regular rate and rhythm, no murmur Lungs: clear to auscultation bilaterally Abdomen: soft, nontender, nondistended, no obvious ascites, no peritoneal signs, normal bowel sounds, no organomegaly Rectal: No external abnormalities.  Slightly tender posterior anal fissure.  Brown stool. Extremities: no clubbing, cyanosis, or lower extremity edema bilaterally Skin: no lesions on visible extremities Neuro: No focal deficits.  Cranial nerves intact  ASSESSMENT:  1.  Posterior anal fissure 2.  History of nonadvanced  adenoma.  Surveillance up-to-date 3.  Sensation of constipation  PLAN:  1.  Metamucil 2 tablespoons daily 2.  Diltiazem ointment.  2%.  Apply 4 times daily as personally instructed. 3.  Sitz bath's once daily 4.  Surveillance colonoscopy around January 2025 5.  Interval follow-up as needed A total time of 30 minutes was spent preparing to see the patient, reviewing test, obtaining history, performing medically appropriate physical exam, counseling the patient regarding her above listed issues, ordering medications, and documenting  clinical information in the health record

## 2021-11-30 ENCOUNTER — Other Ambulatory Visit: Payer: Self-pay | Admitting: Family Medicine

## 2021-12-05 ENCOUNTER — Ambulatory Visit: Payer: 59 | Admitting: Internal Medicine

## 2021-12-12 ENCOUNTER — Other Ambulatory Visit (INDEPENDENT_AMBULATORY_CARE_PROVIDER_SITE_OTHER): Payer: 59

## 2021-12-12 DIAGNOSIS — R748 Abnormal levels of other serum enzymes: Secondary | ICD-10-CM

## 2021-12-12 LAB — HEPATIC FUNCTION PANEL
ALT: 43 U/L — ABNORMAL HIGH (ref 0–35)
AST: 31 U/L (ref 0–37)
Albumin: 4.5 g/dL (ref 3.5–5.2)
Alkaline Phosphatase: 57 U/L (ref 39–117)
Bilirubin, Direct: 0.1 mg/dL (ref 0.0–0.3)
Total Bilirubin: 0.5 mg/dL (ref 0.2–1.2)
Total Protein: 7 g/dL (ref 6.0–8.3)

## 2021-12-12 NOTE — Addendum Note (Signed)
Addended by: Octavio Manns E on: 12/12/2021 08:09 AM   Modules accepted: Orders

## 2022-03-20 ENCOUNTER — Ambulatory Visit (INDEPENDENT_AMBULATORY_CARE_PROVIDER_SITE_OTHER): Payer: 59

## 2022-03-20 ENCOUNTER — Encounter: Payer: Self-pay | Admitting: Family Medicine

## 2022-03-20 ENCOUNTER — Ambulatory Visit: Payer: 59 | Admitting: Family Medicine

## 2022-03-20 VITALS — BP 122/80 | HR 64 | Temp 97.5°F | Ht 63.0 in | Wt 116.5 lb

## 2022-03-20 DIAGNOSIS — M25511 Pain in right shoulder: Secondary | ICD-10-CM

## 2022-03-20 DIAGNOSIS — R7303 Prediabetes: Secondary | ICD-10-CM

## 2022-03-20 DIAGNOSIS — E785 Hyperlipidemia, unspecified: Secondary | ICD-10-CM

## 2022-03-20 LAB — LIPID PANEL
Cholesterol: 169 mg/dL (ref 0–200)
HDL: 75.6 mg/dL (ref >39.00)
LDL Cholesterol: 80 mg/dL (ref 0–99)
NonHDL: 93.39
Total CHOL/HDL Ratio: 2
Triglycerides: 66 mg/dL (ref 0.0–149.0)
VLDL: 13.2 mg/dL (ref 0.0–40.0)

## 2022-03-20 LAB — HEPATIC FUNCTION PANEL
ALT: 37 U/L — ABNORMAL HIGH (ref 0–35)
AST: 25 U/L (ref 0–37)
Albumin: 4.6 g/dL (ref 3.5–5.2)
Alkaline Phosphatase: 59 U/L (ref 39–117)
Bilirubin, Direct: 0.1 mg/dL (ref 0.0–0.3)
Total Bilirubin: 0.6 mg/dL (ref 0.2–1.2)
Total Protein: 7.1 g/dL (ref 6.0–8.3)

## 2022-03-20 LAB — HEMOGLOBIN A1C: Hgb A1c MFr Bld: 6.4 % (ref 4.6–6.5)

## 2022-03-20 MED ORDER — ATORVASTATIN CALCIUM 20 MG PO TABS: 40.0000 mg | ORAL_TABLET | Freq: Every day | ORAL | 3 refills | Status: DC

## 2022-03-20 MED ORDER — MUPIROCIN 2 % EX OINT: 1.0000 "application " | TOPICAL_OINTMENT | Freq: Two times a day (BID) | CUTANEOUS | 0 refills | Status: AC

## 2022-03-20 NOTE — Progress Notes (Signed)
Established Patient Office Visit  Subjective   Patient ID: Shelley Rhodes, female    DOB: 05-16-1961  Age: 61 y.o. MRN: [id number]  Chief Complaint  Patient presents with   Shoulder Pain    Patient complains of right shoulder pain, x2 months, Patient denies any injury   Back Pain    Patient complains of back pain, x2 months, Patient denies any injury     HPI   Sydnie is seen today for multiple issues as follows  Right shoulder pain for the past couple months.  Denies any injury.  She has some pain with internal rotation but none with external rotation or abduction.  No neck pain.  Denies any overuse activities.  Some night pain.  Pain somewhat poorly localized but radiates from the right shoulder area toward the deltoid.  Denies any upper extremity weakness.  She specifically was concerned because she had read association with shoulder pain and possible lung cancer.  Her mom had lung cancer.  Patient had low-dose CT lung cancer screening at [facility] last fall which showed a couple very small pulmonary nodules that are less than 5 mm.  No concerning findings.  Patient is non-smoker and overall low risk.  She denies any recent cough, appetite change, weight loss, dyspnea.  No prior history of cancer.  She has hyperlipidemia treated with Lipitor.  She also takes Zetia.  Requesting follow-up lipids.  She has history of hyperglycemia.  Last A1c 6.1%.  Has been as high as 6.7 in the past.  Currently managed without medication.  Denies any recent polyuria or polydipsia.  She describes some recent numbness involving the very distal tip of the left thumb.  No other fingers involved.  No progression in symptoms.  No weakness.  Denies injury to the thumb.  No neck pain.  Past Medical History:  Diagnosis Date   Appendicitis    Arthritis    knee right    Constipation    occasionally not chronic - uses OTC stool softener    Fibroids    uterine fibroids   Hemorrhoids    History of heartburn     Hyperlipidemia    Hyperlipidemia    Nausea 2001   Right lower quadrant abdominal pain 2001   Past Surgical History:  Procedure Laterality Date   APPENDECTOMY  appendicitis   CESAREAN SECTION     x2   COLONOSCOPY     FINGER SURGERY     right 4th finger   POLYPECTOMY      reports that she has never smoked. She has never used smokeless tobacco. She reports that she does not drink alcohol and does not use drugs. family history includes Cancer in her mother; Diabetes in her brother and father; Heart disease in her brother and brother; Hypertension in her father; Lung cancer in her mother; Stroke in her father. No Known Allergies  Review of Systems  Constitutional:  Negative for chills, fever and weight loss.  Respiratory:  Negative for cough and shortness of breath.   Cardiovascular:  Negative for chest pain.      Objective:     BP 122/80 (BP Location: Left Arm, Patient Position: Sitting, Cuff Size: Normal)   Pulse 64   Temp (!) 97.5 F (36.4 C) (Oral)   Ht '5\' 3"'$  (1.6 m)   Wt 116 lb 8 oz (52.8 kg)   LMP 10/09/2011 (Approximate)   SpO2 98%   BMI 20.64 kg/m    Physical Exam Vitals reviewed.  Constitutional:  Appearance: Normal appearance.  Cardiovascular:     Rate and Rhythm: Normal rate and regular rhythm.     Heart sounds: No murmur heard.    No gallop.     Comments: She has excellent distal pulses radius and ulnar bilaterally.  Both hands are warm to touch with excellent capillary refill throughout. Pulmonary:     Effort: Pulmonary effort is normal.     Breath sounds: Normal breath sounds. No wheezing or rales.  Musculoskeletal:     Right lower leg: No edema.     Left lower leg: No edema.     Comments: Right shoulder reveals fairly good range of motion.  She has slightly restricted range of motion with internal rotation right versus left.  No pain with external rotation or abduction against resistance.  No acromioclavicular tenderness.  No biceps tenderness.   Neurological:     Mental Status: She is alert.     Comments: No rotator cuff weakness.      No results found for any visits on 03/20/22.    The 10-year ASCVD risk score (Arnett DK, et al., 2019) is: 3.7%    Assessment & Plan:   #1 right shoulder pain.  May have some rotator cuff tendinitis.  Pain only reproduced with internal rotation and this was relatively mild.  Patient had concerns regarding association potential of lung cancer and shoulder pain.  She had CT lung cancer screen last fall which was unremarkable other than some nonspecific benign nodules.  She is low risk in terms of no history of smoking/personal history of malignancy, etc.  Does have family history of lung cancer in her mother.  Denies any recent cough, weight loss, etc. -Discussed possible physical therapy but she declines at this time -Recommended some gentle range of motion to avoid loss of range of motion -Obtain x-rays right shoulder -Handout given on rotator cuff stretching and strengthening exercises  #2 hyperlipidemia.  Patient on atorvastatin and Zetia.  Recheck lipid and hepatic panel  #3 history of prediabetes/type 2 diabetes.  Has been managed with diet and exercise.  Recheck A1c today.   No follow-ups on file.    Carolann Littler, MD

## 2022-04-18 ENCOUNTER — Ambulatory Visit: Payer: 59 | Admitting: Podiatry

## 2022-04-18 DIAGNOSIS — B351 Tinea unguium: Secondary | ICD-10-CM | POA: Diagnosis not present

## 2022-04-18 DIAGNOSIS — M79675 Pain in left toe(s): Secondary | ICD-10-CM

## 2022-04-18 DIAGNOSIS — M79674 Pain in right toe(s): Secondary | ICD-10-CM | POA: Diagnosis not present

## 2022-04-27 NOTE — Progress Notes (Signed)
   Chief Complaint  Patient presents with   foot care    Patient is here for routine foot care,the patient states that she has had pain on the right foot 2nd to for 1-2 months,patient also has some discoloration bilateral 2nd toe     SUBJECTIVE Patient presents to office today complaining of dystrophic discoloration of the nails that cause pain while ambulating in shoes.  Patient is unable to trim their own nails. Patient is here for further evaluation and treatment.  Past Medical History:  Diagnosis Date   Appendicitis    Arthritis    knee right    Constipation    occasionally not chronic - uses OTC stool softener    Fibroids    uterine fibroids   Hemorrhoids    History of heartburn    Hyperlipidemia    Hyperlipidemia    Nausea 2001   Right lower quadrant abdominal pain 2001   Past Surgical History:  Procedure Laterality Date   APPENDECTOMY  appendicitis   CESAREAN SECTION     x2   COLONOSCOPY     FINGER SURGERY     right 4th finger   POLYPECTOMY     No Known Allergies  OBJECTIVE General Patient is awake, alert, and oriented x 3 and in no acute distress. Derm Skin is dry and supple bilateral. Negative open lesions or macerations. Remaining integument unremarkable. Nails are tender, long, thickened and dystrophic with subungual debris, consistent with onychomycosis, 1-5 bilateral. No signs of infection noted. Vasc  DP and PT pedal pulses palpable bilaterally. Temperature gradient within normal limits.  Neuro Epicritic and protective threshold sensation grossly intact bilaterally.  Musculoskeletal Exam No symptomatic pedal deformities noted bilateral. Muscular strength within normal limits.  ASSESSMENT 1.  Pain due to onychomycosis of toenails both  PLAN OF CARE 1. Patient evaluated today.  2. Instructed to maintain good pedal hygiene and foot care.  3. Mechanical debridement of nails 1-5 bilaterally performed using a nail nipper. Filed with dremel without incident.   4. Return to clinic in 3 mos.    Edrick Kins, DPM [facility]  Dr. Edrick Kins, DPM    [address], [place] [address]                Office [phone]  Fax [phone]

## 2022-06-15 ENCOUNTER — Ambulatory Visit (INDEPENDENT_AMBULATORY_CARE_PROVIDER_SITE_OTHER): Payer: 59

## 2022-06-15 DIAGNOSIS — Z23 Encounter for immunization: Secondary | ICD-10-CM | POA: Diagnosis not present

## 2022-06-29 ENCOUNTER — Other Ambulatory Visit: Payer: Self-pay | Admitting: Internal Medicine

## 2022-07-24 ENCOUNTER — Encounter: Payer: Self-pay | Admitting: Family Medicine

## 2022-07-24 ENCOUNTER — Telehealth (INDEPENDENT_AMBULATORY_CARE_PROVIDER_SITE_OTHER): Payer: 59 | Admitting: Family Medicine

## 2022-07-24 DIAGNOSIS — U071 COVID-19: Secondary | ICD-10-CM

## 2022-07-24 DIAGNOSIS — J029 Acute pharyngitis, unspecified: Secondary | ICD-10-CM | POA: Diagnosis not present

## 2022-07-24 DIAGNOSIS — R0989 Other specified symptoms and signs involving the circulatory and respiratory systems: Secondary | ICD-10-CM | POA: Diagnosis not present

## 2022-07-24 LAB — POCT RAPID STREP A (OFFICE): Rapid Strep A Screen: NEGATIVE

## 2022-07-24 LAB — POCT INFLUENZA A/B
Influenza A, POC: NEGATIVE
Influenza B, POC: NEGATIVE

## 2022-07-24 LAB — POC COVID19 BINAXNOW: SARS Coronavirus 2 Ag: POSITIVE — AB

## 2022-07-24 MED ORDER — MOLNUPIRAVIR EUA 200MG CAPSULE: 4.0000 | ORAL_CAPSULE | Freq: Two times a day (BID) | ORAL | 0 refills | Status: AC

## 2022-07-24 NOTE — Progress Notes (Signed)
Patient ID: Shelley Rhodes, female   DOB: 04-02-1961, 61 y.o.   MRN: [id number]   Virtual Visit via Video Note  I connected with Shelley Rhodes on 07/24/22 at  4:00 PM EDT by a video enabled telemedicine application and verified that I am speaking with the correct person using two identifiers.  Location patient: home Location provider:work or home office Persons participating in the virtual visit: patient, provider  I discussed the limitations of evaluation and management by telemedicine and the availability of in person appointments. The patient expressed understanding and agreed to proceed.   HPI: Shelley Rhodes and her husband just returned from [place] a few days ago.  She states about 3 to 4 days ago she developed some sore throat, cough, body aches and nasal congestion.  No fever.  She came in today basically for flu vaccine and mentioned her symptoms.  Was tested for influenza which was negative but COVID was positive.  Generally fairly healthy.  She does take Lipitor for hyperlipidemia.  Denies any significant dyspnea.  She does not have any heart history or any chronic lung disease.   ROS: See pertinent positives and negatives per HPI.  Past Medical History:  Diagnosis Date   Appendicitis    Arthritis    knee right    Constipation    occasionally not chronic - uses OTC stool softener    Fibroids    uterine fibroids   Hemorrhoids    History of heartburn    Hyperlipidemia    Hyperlipidemia    Nausea 2001   Right lower quadrant abdominal pain 2001    Past Surgical History:  Procedure Laterality Date   APPENDECTOMY  appendicitis   CESAREAN SECTION     x2   COLONOSCOPY     FINGER SURGERY     right 4th finger   POLYPECTOMY      Family History  Problem Relation Age of Onset   Cancer Mother        Lung   Lung cancer Mother    Stroke Father    Hypertension Father    Diabetes Father    Heart disease Brother    Diabetes Brother    Heart disease Brother    Colon cancer Neg Hx    Colon  polyps Neg Hx    Esophageal cancer Neg Hx    Rectal cancer Neg Hx    Stomach cancer Neg Hx    Pancreatic cancer Neg Hx     SOCIAL HX: Non-smoker   Current Outpatient Medications:    atorvastatin (LIPITOR) 20 MG tablet, Take 2 tablets (40 mg total) by mouth daily., Disp: 180 tablet, Rfl: 3   Cholecalciferol (VITAMIN D-3) 1000 UNITS CAPS, Take 2,000 Units by mouth daily. , Disp: , Rfl:    ezetimibe (ZETIA) 10 MG tablet, Take 1 tablet (10 mg total) by mouth daily. (Patient taking differently: Take 10 mg by mouth every other day.), Disp: 90 tablet, Rfl: 3   Magnesium 400 MG CAPS, Take 400 mg by mouth as needed., Disp: , Rfl:    molnupiravir EUA (LAGEVRIO) 200 mg CAPS capsule, Take 4 capsules (800 mg total) by mouth 2 (two) times daily for 5 days., Disp: 40 capsule, Rfl: 0   mupirocin ointment (BACTROBAN) 2 %, Apply 1 application  topically 2 (two) times daily., Disp: 22 g, Rfl: 0  EXAM:  VITALS per patient if applicable:  GENERAL: alert, oriented, appears well and in no acute distress  HEENT: atraumatic, conjunttiva clear, no obvious abnormalities on  inspection of external nose and ears  NECK: normal movements of the head and neck  LUNGS: on inspection no signs of respiratory distress, breathing rate appears normal, no obvious gross SOB, gasping or wheezing  CV: no obvious cyanosis  MS: moves all visible extremities without noticeable abnormality  PSYCH/NEURO: pleasant and cooperative, no obvious depression or anxiety, speech and thought processing grossly intact  ASSESSMENT AND PLAN:  Discussed the following assessment and plan:   COVID infection.  Strep testing and influenza screen negative.  COVID positive.  Patient requesting antivirals.  She is in 5-day window.  We will start molnupiravir 4 capsules by mouth twice daily for 5 days. -Plenty fluids and rest -Continue isolation for another day or 2 -Follow-up for any persistent or worsening symptoms    I discussed the  assessment and treatment plan with the patient. The patient was provided an opportunity to ask questions and all were answered. The patient agreed with the plan and demonstrated an understanding of the instructions.   The patient was advised to call back or seek an in-person evaluation if the symptoms worsen or if the condition fails to improve as anticipated.     Carolann Littler, MD

## 2022-07-25 NOTE — Progress Notes (Signed)
A user error has taken place: orders placed in error, not carried out on this patient.  Patient was sick when came in for appointment, visit was cancelled.

## 2022-09-17 ENCOUNTER — Encounter: Payer: Self-pay | Admitting: Family Medicine

## 2022-09-17 ENCOUNTER — Ambulatory Visit (INDEPENDENT_AMBULATORY_CARE_PROVIDER_SITE_OTHER): Payer: 59 | Admitting: Family Medicine

## 2022-09-17 VITALS — BP 130/70 | HR 62 | Temp 98.2°F | Ht 62.21 in | Wt 117.8 lb

## 2022-09-17 DIAGNOSIS — R252 Cramp and spasm: Secondary | ICD-10-CM

## 2022-09-17 DIAGNOSIS — R739 Hyperglycemia, unspecified: Secondary | ICD-10-CM | POA: Diagnosis not present

## 2022-09-17 DIAGNOSIS — Z0000 Encounter for general adult medical examination without abnormal findings: Secondary | ICD-10-CM

## 2022-09-17 LAB — CBC WITH DIFFERENTIAL/PLATELET
Basophils Absolute: 0 10*3/uL (ref 0.0–0.1)
Basophils Relative: 0.7 % (ref 0.0–3.0)
Eosinophils Absolute: 0.1 10*3/uL (ref 0.0–0.7)
Eosinophils Relative: 2 % (ref 0.0–5.0)
HCT: 43.7 % (ref 36.0–46.0)
Hemoglobin: 14.6 g/dL (ref 12.0–15.0)
Lymphocytes Relative: 30 % (ref 12.0–46.0)
Lymphs Abs: 1.5 10*3/uL (ref 0.7–4.0)
MCHC: 33.4 g/dL (ref 30.0–36.0)
MCV: 89.5 fl (ref 78.0–100.0)
Monocytes Absolute: 0.3 10*3/uL (ref 0.1–1.0)
Monocytes Relative: 6.8 % (ref 3.0–12.0)
Neutro Abs: 3 10*3/uL (ref 1.4–7.7)
Neutrophils Relative %: 60.5 % (ref 43.0–77.0)
Platelets: 349 10*3/uL (ref 150.0–400.0)
RBC: 4.89 Mil/uL (ref 3.87–5.11)
RDW: 13.6 % (ref 11.5–15.5)
WBC: 5 10*3/uL (ref 4.0–10.5)

## 2022-09-17 LAB — BASIC METABOLIC PANEL
BUN: 14 mg/dL (ref 6–23)
CO2: 27 mEq/L (ref 19–32)
Calcium: 9.7 mg/dL (ref 8.4–10.5)
Chloride: 104 mEq/L (ref 96–112)
Creatinine, Ser: 0.61 mg/dL (ref 0.40–1.20)
GFR: 97.33 mL/min (ref >60.00)
Glucose, Bld: 111 mg/dL — ABNORMAL HIGH (ref 70–99)
Potassium: 3.8 mEq/L (ref 3.5–5.1)
Sodium: 140 mEq/L (ref 135–145)

## 2022-09-17 LAB — LIPID PANEL
Cholesterol: 185 mg/dL (ref 0–200)
HDL: 76.5 mg/dL (ref >39.00)
LDL Cholesterol: 92 mg/dL (ref 0–99)
NonHDL: 108.1
Total CHOL/HDL Ratio: 2
Triglycerides: 83 mg/dL (ref 0.0–149.0)
VLDL: 16.6 mg/dL (ref 0.0–40.0)

## 2022-09-17 LAB — MICROALBUMIN / CREATININE URINE RATIO
Creatinine,U: 14.7 mg/dL
Microalb Creat Ratio: 4.8 mg/g (ref 0.0–30.0)
Microalb, Ur: <0.7 mg/dL (ref 0.0–1.9)

## 2022-09-17 LAB — HEMOGLOBIN A1C: Hgb A1c MFr Bld: 6.5 % (ref 4.6–6.5)

## 2022-09-17 LAB — HEPATIC FUNCTION PANEL
ALT: 35 U/L (ref 0–35)
AST: 24 U/L (ref 0–37)
Albumin: 4.6 g/dL (ref 3.5–5.2)
Alkaline Phosphatase: 66 U/L (ref 39–117)
Bilirubin, Direct: 0.1 mg/dL (ref 0.0–0.3)
Total Bilirubin: 0.8 mg/dL (ref 0.2–1.2)
Total Protein: 7.4 g/dL (ref 6.0–8.3)

## 2022-09-17 LAB — MAGNESIUM: Magnesium: 2.2 mg/dL (ref 1.5–2.5)

## 2022-09-17 LAB — TSH: TSH: 2.67 u[IU]/mL (ref 0.35–5.50)

## 2022-09-17 NOTE — Progress Notes (Signed)
Established Patient Office Visit  Subjective   Patient ID: Shelley Rhodes, female    DOB: 1961/09/02  Age: 61 y.o. MRN: [id number]  Chief Complaint  Patient presents with   Annual Exam    HPI   Shelley Rhodes is seen for physical/well care.  She sees gynecologist and had Pap smear and mammogram earlier this year.  She has history of stable lung nodule which has been followed at [facility] through low-dose CT lung cancer screen.  Her mother died of lung cancer age 20.  She has very strong family history of CAD as well but had coronary calcium score of zero 9/22.  She does take Lipitor 10 mg daily along with Zetia.  Exercises with about 5000 steps per day walking.  No recent chest pains.  She had COVID infection back in March and was treated with molnupiravir and probably cleared her symptoms.  She had occasional leg cramps which have been improved with magnesium supplement.  She does have history of hyperglycemia but controlled without medication.  Maintenance reviewed  -Flu vaccine already given -Shingrix vaccine already completed -Prior hepatitis C screen negative -Tetanus due 2031 -Pap smear mammogram up-to-date -Colonoscopy due 1/25  Social history-married.  She has a son who is ophthalmologist in [place].  She has daughter lives in [place].  Patient has never smoked.  No alcohol.   Family history-mother died age 57 of lung cancer.  She was a non-smoker.  Father had type 2 diabetes and stroke.  She has a brother with type 2 diabetes.  Both her brothers have history of CAD.  Past Medical History:  Diagnosis Date   Appendicitis    Arthritis    knee right    Constipation    occasionally not chronic - uses OTC stool softener    Fibroids    uterine fibroids   Hemorrhoids    History of heartburn    Hyperlipidemia    Hyperlipidemia    Nausea 2001   Right lower quadrant abdominal pain 2001   Past Surgical History:  Procedure Laterality Date   APPENDECTOMY  appendicitis   CESAREAN  SECTION     x2   COLONOSCOPY     FINGER SURGERY     right 4th finger   POLYPECTOMY      reports that she has never smoked. She has never used smokeless tobacco. She reports that she does not drink alcohol and does not use drugs. family history includes Cancer in her mother; Diabetes in her brother and father; Heart disease in her brother and brother; Hypertension in her father; Lung cancer in her mother; Stroke in her father. No Known Allergies    Review of Systems  Constitutional:  Negative for chills, fever, malaise/fatigue and weight loss.  HENT:  Negative for hearing loss.   Eyes:  Negative for blurred vision and double vision.  Respiratory:  Negative for cough and shortness of breath.   Cardiovascular:  Negative for chest pain, palpitations and leg swelling.  Gastrointestinal:  Negative for abdominal pain, blood in stool, constipation and diarrhea.  Genitourinary:  Negative for dysuria.  Skin:  Negative for rash.  Neurological:  Negative for dizziness, speech change, seizures, loss of consciousness and headaches.  Psychiatric/Behavioral:  Negative for depression.       Objective:     BP 130/70 (BP Location: Left Arm, Patient Position: Sitting, Cuff Size: Normal)   Pulse 62   Temp 98.2 F (36.8 C) (Oral)   Ht 5' 2.21" (1.58 m)   Wt  117 lb 12.8 oz (53.4 kg)   LMP 10/09/2011 (Approximate)   SpO2 98%   BMI 21.40 kg/m  BP Readings from Last 3 Encounters:  09/17/22 130/70  03/20/22 122/80  11/07/21 112/80   Wt Readings from Last 3 Encounters:  09/17/22 117 lb 12.8 oz (53.4 kg)  03/20/22 116 lb 8 oz (52.8 kg)  11/07/21 115 lb 6 oz (52.3 kg)      Physical Exam Constitutional:      Appearance: She is well-developed.  HENT:     Head: Normocephalic and atraumatic.  Eyes:     Pupils: Pupils are equal, round, and reactive to light.  Neck:     Thyroid: No thyromegaly.  Cardiovascular:     Rate and Rhythm: Normal rate and regular rhythm.     Heart sounds: Normal  heart sounds. No murmur heard. Pulmonary:     Effort: No respiratory distress.     Breath sounds: Normal breath sounds. No wheezing or rales.  Abdominal:     General: Bowel sounds are normal. There is no distension.     Palpations: Abdomen is soft. There is no mass.     Tenderness: There is no abdominal tenderness. There is no guarding or rebound.  Musculoskeletal:        General: Normal range of motion.     Cervical back: Normal range of motion and neck supple.  Lymphadenopathy:     Cervical: No cervical adenopathy.  Skin:    Findings: No rash.  Neurological:     Mental Status: She is alert and oriented to person, place, and time.     Cranial Nerves: No cranial nerve deficit.  Psychiatric:        Behavior: Behavior normal.        Thought Content: Thought content normal.        Judgment: Judgment normal.      No results found for any visits on 09/17/22.    The 10-year ASCVD risk score (Arnett DK, et al., 2019) is: 4.6%    Assessment & Plan:   Problem List Items Addressed This Visit   None Visit Diagnoses     Physical exam    -  Primary   Relevant Orders   Basic metabolic panel   Lipid panel   CBC with Differential/Platelet   TSH   Hepatic function panel   Hemoglobin A1c   Leg cramps       Relevant Orders   Magnesium   Hyperglycemia       Relevant Orders   Hemoglobin A1c   Microalbumin / creatinine urine ratio     - Obtain screening labs as above -She states she had DEXA scan earlier this year through GYN. -Due for repeat colonoscopy 2025 -Check electrolytes including magnesium with reports of frequent muscle cramps recently. -Discussed importance of ongoing weightbearing exercise such as walking and consider some resistance exercises as well   No follow-ups on file.    Carolann Littler, MD

## 2022-09-17 NOTE — Patient Instructions (Signed)
Consider Ahtanum for general skin concerns.

## 2022-10-15 MED ORDER — ROSUVASTATIN CALCIUM 10 MG PO TABS: 10.0000 mg | ORAL_TABLET | Freq: Every day | ORAL | 3 refills | Status: DC

## 2022-10-15 NOTE — Addendum Note (Signed)
Addended by: Nilda Riggs on: 10/15/2022 01:35 PM   Modules accepted: Orders

## 2022-10-15 NOTE — Addendum Note (Signed)
Addended by: Nilda Riggs on: 10/15/2022 01:33 PM   Modules accepted: Orders

## 2023-07-01 ENCOUNTER — Ambulatory Visit: Payer: 59 | Admitting: Family Medicine

## 2023-07-01 ENCOUNTER — Encounter: Payer: Self-pay | Admitting: Family Medicine

## 2023-07-01 VITALS — BP 130/72 | HR 56 | Temp 97.6°F | Ht 62.21 in | Wt 114.8 lb

## 2023-07-01 DIAGNOSIS — E785 Hyperlipidemia, unspecified: Secondary | ICD-10-CM

## 2023-07-01 DIAGNOSIS — Z23 Encounter for immunization: Secondary | ICD-10-CM | POA: Diagnosis not present

## 2023-07-01 DIAGNOSIS — R739 Hyperglycemia, unspecified: Secondary | ICD-10-CM | POA: Diagnosis not present

## 2023-07-01 LAB — MICROALBUMIN / CREATININE URINE RATIO
Creatinine,U: 12.3 mg/dL
Microalb Creat Ratio: 34.5 mg/g — ABNORMAL HIGH (ref 0.0–30.0)
Microalb, Ur: 4.2 mg/dL — ABNORMAL HIGH (ref 0.0–1.9)

## 2023-07-01 LAB — COMPREHENSIVE METABOLIC PANEL
ALT: 27 U/L (ref 0–35)
AST: 23 U/L (ref 0–37)
Albumin: 4.5 g/dL (ref 3.5–5.2)
Alkaline Phosphatase: 57 U/L (ref 39–117)
BUN: 18 mg/dL (ref 6–23)
CO2: 25 mEq/L (ref 19–32)
Calcium: 9.4 mg/dL (ref 8.4–10.5)
Chloride: 105 mEq/L (ref 96–112)
Creatinine, Ser: 0.64 mg/dL (ref 0.40–1.20)
GFR: 95.68 mL/min (ref >60.00)
Glucose, Bld: 100 mg/dL — ABNORMAL HIGH (ref 70–99)
Potassium: 3.7 mEq/L (ref 3.5–5.1)
Sodium: 140 mEq/L (ref 135–145)
Total Bilirubin: 0.7 mg/dL (ref 0.2–1.2)
Total Protein: 7.1 g/dL (ref 6.0–8.3)

## 2023-07-01 LAB — LIPID PANEL
Cholesterol: 195 mg/dL (ref 0–200)
HDL: 70.6 mg/dL (ref >39.00)
LDL Cholesterol: 106 mg/dL — ABNORMAL HIGH (ref 0–99)
NonHDL: 124.6
Total CHOL/HDL Ratio: 3
Triglycerides: 91 mg/dL (ref 0.0–149.0)
VLDL: 18.2 mg/dL (ref 0.0–40.0)

## 2023-07-01 LAB — HEMOGLOBIN A1C: Hgb A1c MFr Bld: 6.4 % (ref 4.6–6.5)

## 2023-07-01 MED ORDER — ROSUVASTATIN CALCIUM 10 MG PO TABS: 10.0000 mg | ORAL_TABLET | Freq: Every day | ORAL | 3 refills | Status: DC

## 2023-07-01 NOTE — Progress Notes (Signed)
Established Patient Office Visit  Subjective   Patient ID: Shelley Rhodes, female    DOB: August 27, 1961  Age: 62 y.o. MRN: [id number]  Chief Complaint  Patient presents with   Medical Management of Chronic Issues    HPI   Shelley Rhodes is here for medical follow-up.  She has history of hyperlipidemia treated with rosuvastatin 10 mg daily.  Shelley Rhodes had CT angiogram coronaries couple years ago with coronary calcium score of 0 and no concerns.  She does have 2 brothers with CAD.  1 is had stents and the other had bypass both in their 60s.  She is tolerating rosuvastatin 10 mg daily without any side effects.  She did have side effects previously with Lipitor.  No myalgias.  She and her husband just got back from a long trip for [place] for 1 week and they were in [place] for couple weeks visiting family. She does have history of borderline diabetes range blood sugars.  Last A1c was 6.5%.  No polyuria or polydipsia.  Appetite and weight stable.  She is very diligent to follow low glycemic diet.  Currently not exercising regularly.  She knows she needs to increase this.  She does need flu vaccine.  She sees GYN for Pap smears and is getting regular mammograms.  Colonoscopy will be due 2025  Past Medical History:  Diagnosis Date   Appendicitis    Arthritis    knee right    Constipation    occasionally not chronic - uses OTC stool softener    Fibroids    uterine fibroids   Hemorrhoids    History of heartburn    Hyperlipidemia    Hyperlipidemia    Nausea 2001   Right lower quadrant abdominal pain 2001   Past Surgical History:  Procedure Laterality Date   APPENDECTOMY  appendicitis   CESAREAN SECTION     x2   COLONOSCOPY     FINGER SURGERY     right 4th finger   POLYPECTOMY      reports that she has never smoked. She has never used smokeless tobacco. She reports that she does not drink alcohol and does not use drugs. family history includes Cancer in her mother; Diabetes in her brother and father;  Heart disease in her brother and brother; Hypertension in her father; Lung cancer in her mother; Stroke in her father. No Known Allergies  Review of Systems  Constitutional:  Negative for malaise/fatigue.  Eyes:  Negative for blurred vision.  Respiratory:  Negative for shortness of breath.   Cardiovascular:  Negative for chest pain.  Neurological:  Negative for dizziness, weakness and headaches.      Objective:     BP 130/72 (BP Location: Left Arm, Patient Position: Sitting, Cuff Size: Normal)   Pulse (!) 56   Temp 97.6 F (36.4 C) (Oral)   Ht 5' 2.21" (1.58 m)   Wt 114 lb 12.8 oz (52.1 kg)   LMP 10/09/2011 (Approximate)   SpO2 97%   BMI 20.86 kg/m  BP Readings from Last 3 Encounters:  07/01/23 130/72  09/17/22 130/70  03/20/22 122/80   Wt Readings from Last 3 Encounters:  07/01/23 114 lb 12.8 oz (52.1 kg)  09/17/22 117 lb 12.8 oz (53.4 kg)  03/20/22 116 lb 8 oz (52.8 kg)      Physical Exam Vitals reviewed.  Constitutional:      General: She is not in acute distress.    Appearance: She is well-developed. She is not ill-appearing.  Eyes:  Pupils: Pupils are equal, round, and reactive to light.  Neck:     Thyroid: No thyromegaly.     Vascular: No JVD.  Cardiovascular:     Rate and Rhythm: Normal rate and regular rhythm.     Heart sounds: No murmur heard.    No gallop.  Pulmonary:     Effort: Pulmonary effort is normal. No respiratory distress.     Breath sounds: Normal breath sounds. No wheezing or rales.  Musculoskeletal:     Cervical back: Neck supple.     Right lower leg: No edema.     Left lower leg: No edema.  Neurological:     Mental Status: She is alert.      No results found for any visits on 07/01/23.  Last CBC Lab Results  Component Value Date   WBC 5.0 09/17/2022   HGB 14.6 09/17/2022   HCT 43.7 09/17/2022   MCV 89.5 09/17/2022   MCH 29.4 09/12/2020   RDW 13.6 09/17/2022   PLT 349.0 09/17/2022   Last metabolic panel Lab Results   Component Value Date   GLUCOSE 111 (H) 09/17/2022   NA 140 09/17/2022   K 3.8 09/17/2022   CL 104 09/17/2022   CO2 27 09/17/2022   BUN 14 09/17/2022   CREATININE 0.61 09/17/2022   GFR 97.33 09/17/2022   CALCIUM 9.7 09/17/2022   PROT 7.4 09/17/2022   ALBUMIN 4.6 09/17/2022   BILITOT 0.8 09/17/2022   ALKPHOS 66 09/17/2022   AST 24 09/17/2022   ALT 35 09/17/2022   Last lipids Lab Results  Component Value Date   CHOL 185 09/17/2022   HDL 76.50 09/17/2022   LDLCALC 92 09/17/2022   LDLDIRECT 135.9 06/23/2013   TRIG 83.0 09/17/2022   CHOLHDL 2 09/17/2022   Last hemoglobin A1c Lab Results  Component Value Date   HGBA1C 6.5 09/17/2022      The 10-year ASCVD risk score (Arnett DK, et al., 2019) is: 4.9%    Assessment & Plan:   #1 hyperlipidemia.  Treated with rosuvastatin 10 mg daily.  Repeat fasting lipid today along with CMP.  Continue low saturated fat diet.  Handout given on heart healthy diet.  Strong family history of CAD as above  #2 hyperglycemia with borderline diabetes range blood sugars.  Recheck A1c today.  Continue low glycemic diet.  If A1c climbing consider treatment options such as low-dose metformin  #3 health maintenance-flu vaccine given.  She needs repeat colonoscopy early 2025.  She plans to continue GYN follow-up for Pap smears and mammograms.   No follow-ups on file.    Evelena Peat, MD

## 2023-07-02 ENCOUNTER — Telehealth: Payer: Self-pay | Admitting: Family Medicine

## 2023-07-02 NOTE — Telephone Encounter (Signed)
Left a message for the patient to return my call.

## 2023-07-02 NOTE — Telephone Encounter (Signed)
Pt has viewed her blood work results on [employer] and would like someone to go over the results .Please call

## 2023-07-05 NOTE — Telephone Encounter (Signed)
Please see result note 

## 2023-09-24 ENCOUNTER — Other Ambulatory Visit: Payer: Self-pay | Admitting: Family Medicine

## 2023-09-24 ENCOUNTER — Ambulatory Visit (INDEPENDENT_AMBULATORY_CARE_PROVIDER_SITE_OTHER): Payer: 59 | Admitting: Family Medicine

## 2023-09-24 ENCOUNTER — Encounter: Payer: Self-pay | Admitting: Family Medicine

## 2023-09-24 VITALS — BP 122/80 | HR 63 | Temp 98.3°F | Ht 63.39 in | Wt 117.3 lb

## 2023-09-24 DIAGNOSIS — Z0000 Encounter for general adult medical examination without abnormal findings: Secondary | ICD-10-CM | POA: Diagnosis not present

## 2023-09-24 DIAGNOSIS — E785 Hyperlipidemia, unspecified: Secondary | ICD-10-CM | POA: Diagnosis not present

## 2023-09-24 DIAGNOSIS — Z1211 Encounter for screening for malignant neoplasm of colon: Secondary | ICD-10-CM

## 2023-09-24 DIAGNOSIS — Z124 Encounter for screening for malignant neoplasm of cervix: Secondary | ICD-10-CM | POA: Diagnosis not present

## 2023-09-24 DIAGNOSIS — R739 Hyperglycemia, unspecified: Secondary | ICD-10-CM | POA: Diagnosis not present

## 2023-09-24 LAB — COMPREHENSIVE METABOLIC PANEL
ALT: 19 U/L (ref 0–35)
AST: 20 U/L (ref 0–37)
Albumin: 4.5 g/dL (ref 3.5–5.2)
Alkaline Phosphatase: 59 U/L (ref 39–117)
BUN: 17 mg/dL (ref 6–23)
CO2: 26 meq/L (ref 19–32)
Calcium: 9 mg/dL (ref 8.4–10.5)
Chloride: 102 meq/L (ref 96–112)
Creatinine, Ser: 0.61 mg/dL (ref 0.40–1.20)
GFR: 96.63 mL/min (ref >60.00)
Glucose, Bld: 114 mg/dL — ABNORMAL HIGH (ref 70–99)
Potassium: 3.9 meq/L (ref 3.5–5.1)
Sodium: 136 meq/L (ref 135–145)
Total Bilirubin: 0.5 mg/dL (ref 0.2–1.2)
Total Protein: 7.1 g/dL (ref 6.0–8.3)

## 2023-09-24 LAB — CBC WITH DIFFERENTIAL/PLATELET
Basophils Absolute: 0 10*3/uL (ref 0.0–0.1)
Basophils Relative: 0.8 % (ref 0.0–3.0)
Eosinophils Absolute: 0.1 10*3/uL (ref 0.0–0.7)
Eosinophils Relative: 1.9 % (ref 0.0–5.0)
HCT: 42.4 % (ref 36.0–46.0)
Hemoglobin: 14.1 g/dL (ref 12.0–15.0)
Lymphocytes Relative: 26.8 % (ref 12.0–46.0)
Lymphs Abs: 1.4 10*3/uL (ref 0.7–4.0)
MCHC: 33.1 g/dL (ref 30.0–36.0)
MCV: 90.1 fL (ref 78.0–100.0)
Monocytes Absolute: 0.3 10*3/uL (ref 0.1–1.0)
Monocytes Relative: 5.5 % (ref 3.0–12.0)
Neutro Abs: 3.3 10*3/uL (ref 1.4–7.7)
Neutrophils Relative %: 65 % (ref 43.0–77.0)
Platelets: 387 10*3/uL (ref 150.0–400.0)
RBC: 4.71 Mil/uL (ref 3.87–5.11)
RDW: 13.3 % (ref 11.5–15.5)
WBC: 5.1 10*3/uL (ref 4.0–10.5)

## 2023-09-24 LAB — MICROALBUMIN / CREATININE URINE RATIO
Creatinine,U: 10 mg/dL
Microalb Creat Ratio: 7 mg/g (ref 0.0–30.0)
Microalb, Ur: <0.7 mg/dL (ref 0.0–1.9)

## 2023-09-24 LAB — LIPID PANEL
Cholesterol: 202 mg/dL — ABNORMAL HIGH (ref 0–200)
HDL: 74.8 mg/dL (ref >39.00)
LDL Cholesterol: 114 mg/dL — ABNORMAL HIGH (ref 0–99)
NonHDL: 127.48
Total CHOL/HDL Ratio: 3
Triglycerides: 66 mg/dL (ref 0.0–149.0)
VLDL: 13.2 mg/dL (ref 0.0–40.0)

## 2023-09-24 LAB — HEMOGLOBIN A1C: Hgb A1c MFr Bld: 6.5 % (ref 4.6–6.5)

## 2023-09-24 MED ORDER — DEXCOM G7 RECEIVER DEVI: 2 refills | Status: DC

## 2023-09-24 MED ORDER — DEXCOM G7 SENSOR MISC: 2 refills | Status: DC

## 2023-09-24 NOTE — Progress Notes (Signed)
Established Patient Office Visit  Subjective   Patient ID: Shelley Rhodes, female    DOB: Jan 18, 1961  Age: 62 y.o. MRN: [id number]  No chief complaint on file.   HPI   Shelley Rhodes is seen today for physical exam.  She has hyperlipidemia treated with rosuvastatin 10 mg daily.  Small pulmonary nodule followed through [facility] low-dose CT lung cancer screening program.  Patient's mom died of lung cancer age 62.  She does have strong family history of CAD with 2 brothers having CAD history.  She had coronary calcium scan of 09/22.  Fairly diligent with exercise.  Walks most days of the week.  She does have history of hyperglycemia which has been managed with diet.  Family history of diabetes as below.  Sees GYN and had Pap smear last March which was normal.  Getting mammograms through GYN as well.  She is due for repeat colonoscopy this January.  Other health maintenance reviewed as below  Health Maintenance  Topic Date Due   HIV Screening  Never done   Cervical Cancer Screening (HPV/Pap Cotest)  07/09/2019   COVID-19 Vaccine (4 - 2024-25 season) 06/09/2023   Colonoscopy  10/11/2023   MAMMOGRAM  12/20/2023   DTaP/Tdap/Td (3 - Td or Tdap) 02/15/2030   INFLUENZA VACCINE  Completed   Hepatitis C Screening  Completed   Zoster Vaccines- Shingrix  Completed   HPV VACCINES  Aged Out   Social history-married.  She has a son and a daughter.  Son's ophthalmologist who recently moved from [place] to [place].  Daughter also lives in [place].  Patient has no history of smoking.  No alcohol.  Family history-mother died age 67 of lung cancer.  She was a non-smoker.  Father had type 2 diabetes and stroke.  She has a brother with type 2 diabetes.  Both her brothers have history of CAD.   Past Medical History:  Diagnosis Date   Appendicitis    Arthritis    knee right    Constipation    occasionally not chronic - uses OTC stool softener    Fibroids    uterine fibroids   Hemorrhoids    History of  heartburn    Hyperlipidemia    Hyperlipidemia    Nausea 2001   Right lower quadrant abdominal pain 2001   Past Surgical History:  Procedure Laterality Date   APPENDECTOMY  appendicitis   CESAREAN SECTION     x2   COLONOSCOPY     FINGER SURGERY     right 4th finger   POLYPECTOMY      reports that she has never smoked. She has never used smokeless tobacco. She reports that she does not drink alcohol and does not use drugs. family history includes Cancer in her mother; Diabetes in her brother and father; Heart disease in her brother and brother; Hypertension in her father; Lung cancer in her mother; Stroke in her father. No Known Allergies  Review of Systems  Constitutional:  Negative for chills, fever, malaise/fatigue and weight loss.  HENT:  Negative for hearing loss.   Eyes:  Negative for blurred vision and double vision.  Respiratory:  Negative for cough and shortness of breath.   Cardiovascular:  Negative for chest pain, palpitations and leg swelling.  Gastrointestinal:  Negative for abdominal pain, blood in stool, constipation and diarrhea.  Genitourinary:  Negative for dysuria.  Skin:  Negative for rash.  Neurological:  Negative for dizziness, speech change, seizures, loss of consciousness and headaches.  Psychiatric/Behavioral:  Negative for depression.       Objective:     BP 122/80 (BP Location: Left Arm, Patient Position: Sitting)   Pulse 63   Temp 98.3 F (36.8 C) (Oral)   Ht 5' 3.39" (1.61 m)   Wt 117 lb 4.8 oz (53.2 kg)   LMP 10/09/2011 (Approximate)   SpO2 98%   BMI 20.53 kg/m  BP Readings from Last 3 Encounters:  09/24/23 122/80  07/01/23 130/72  09/17/22 130/70   Wt Readings from Last 3 Encounters:  09/24/23 117 lb 4.8 oz (53.2 kg)  07/01/23 114 lb 12.8 oz (52.1 kg)  09/17/22 117 lb 12.8 oz (53.4 kg)      Physical Exam Vitals reviewed.  Constitutional:      General: She is not in acute distress.    Appearance: She is well-developed. She is  not ill-appearing.  HENT:     Head: Normocephalic and atraumatic.  Eyes:     Pupils: Pupils are equal, round, and reactive to light.  Neck:     Thyroid: No thyromegaly.  Cardiovascular:     Rate and Rhythm: Normal rate and regular rhythm.     Heart sounds: Normal heart sounds. No murmur heard. Pulmonary:     Effort: No respiratory distress.     Breath sounds: Normal breath sounds. No wheezing or rales.  Abdominal:     General: Bowel sounds are normal. There is no distension.     Palpations: Abdomen is soft. There is no mass.     Tenderness: There is no abdominal tenderness. There is no guarding or rebound.  Musculoskeletal:        General: Normal range of motion.     Cervical back: Normal range of motion and neck supple.     Right lower leg: No edema.     Left lower leg: No edema.  Lymphadenopathy:     Cervical: No cervical adenopathy.  Skin:    Findings: No rash.  Neurological:     Mental Status: She is alert and oriented to person, place, and time.     Cranial Nerves: No cranial nerve deficit.  Psychiatric:        Behavior: Behavior normal.        Thought Content: Thought content normal.        Judgment: Judgment normal.      No results found for any visits on 09/24/23.    The 10-year ASCVD risk score (Arnett DK, et al., 2019) is: 5.4%    Assessment & Plan:   Physical exam.  She has history of type 2 diabetes which has been diet and exercise controlled as well as hyperlipidemia.  Strong family history of CAD but she had coronary calcium score of 09/22.  Small pulmonary nodule followed by [facility] with annual low-dose CT lung cancer screening Discussed several health maintenance issues as below  -Set up referral for repeat colonoscopy -Flu vaccine already given -Other immunizations up-to-date -Did discuss possible continuous glucose monitor if we can get this covered by insurance -Continue regular exercise habits with aerobic and resistance training. -Obtain labs  including lipid, CMP, A1c, urine microalbumin, and CBC -She is getting Pap smears and mammogram through her GYN -Set up 83-month follow-up to reassess diabetes   Return in about 3 months (around 12/23/2023).    Evelena Peat, MD

## 2023-10-03 ENCOUNTER — Telehealth: Payer: Self-pay

## 2023-10-03 ENCOUNTER — Other Ambulatory Visit (HOSPITAL_COMMUNITY): Payer: Self-pay

## 2023-10-03 NOTE — Telephone Encounter (Signed)
Per test claim, both Freestyle Libre 3 sensors and Dexcom G7 sensors are not covered due to "plan/benefit exclusion", no PA submitted at this time.

## 2023-10-03 NOTE — Telephone Encounter (Signed)
Please sign off on rx in this encounter as PA team is unable to resolve RX requests. Thank you

## 2023-10-03 NOTE — Telephone Encounter (Signed)
Patient informed of denial

## 2023-10-03 NOTE — Telephone Encounter (Signed)
Pharmacy Patient Advocate Encounter   Received notification from RX Request Messages that prior authorization for Dexcom Sensors is required/requested.   Insurance verification completed.   The patient is insured through [facility] ADVANTAGE/RX ADVANCE .   Per test claim: Not covered due to plan/benefit exclusion

## 2023-11-05 ENCOUNTER — Ambulatory Visit (AMBULATORY_SURGERY_CENTER): Payer: 59 | Admitting: *Deleted

## 2023-11-05 VITALS — Ht 63.0 in | Wt 116.0 lb

## 2023-11-05 DIAGNOSIS — Z860100 Personal history of colon polyps, unspecified: Secondary | ICD-10-CM

## 2023-11-05 MED ORDER — SUFLAVE 178.7 G PO SOLR: 1.0000 | Freq: Once | ORAL | 0 refills | Status: AC

## 2023-11-05 MED ORDER — SUFLAVE 178.7 G PO SOLR: 1.0000 | Freq: Once | ORAL | 0 refills | Status: DC

## 2023-11-05 NOTE — Progress Notes (Signed)
Pt's name and DOB verified at the beginning of the pre-visit wit 2 identifiers  Pt denies any difficulty with ambulating,sitting, laying down or rolling side to side  Pt has no issues with ambulation   Pt has no issues moving head neck or swallowing  No egg or soy allergy known to patient   No issues known to pt with past sedation with any surgeries or procedures  Pt denies having issues being intubated  No FH of Malignant Hyperthermia  Pt is not on diet pills or shots  Pt is not on home 02   Pt is not on blood thinners   Pt denies issues with constipation   Pt is not on dialysis  Pt denise any abnormal heart rhythms   Pt denies any upcoming cardiac testing  Pt encouraged to use to use Singlecare or Goodrx to reduce cost   Patient's chart reviewed by Cathlyn Parsons CNRA prior to pre-visit and patient appropriate for the [facility].  Pre-visit completed and red dot placed by patient's name on their procedure day (on provider's schedule).  .  Visit by phone  Pt states her weight 116 lb  Instructed pt why it is important to and  to call if they have any changes in health or new medications. Directed them to the # given and on instructions.     Instructions reviewed. Pt given both [facility] main # and MD on call # prior to instructions.  Pt states understanding. Instructed to review again prior to procedure. Pt states they will.   Informed pt that they will receive a call from [facility] regarding there prep med.

## 2023-11-18 ENCOUNTER — Telehealth: Payer: Self-pay | Admitting: Internal Medicine

## 2023-11-18 DIAGNOSIS — Z85038 Personal history of other malignant neoplasm of large intestine: Secondary | ICD-10-CM

## 2023-11-18 MED ORDER — NA SULFATE-K SULFATE-MG SULF 17.5-3.13-1.6 GM/177ML PO SOLN: 1.0000 | Freq: Once | ORAL | 0 refills | Status: AC

## 2023-11-18 NOTE — Telephone Encounter (Signed)
 PT is calling about prep information. I connected him to [facility] and they advised that there hasn't been a prescription sent in. Please advise.

## 2023-11-18 NOTE — Telephone Encounter (Signed)
 Returned call.  Patient states she has not received her bowel prep from [facility].  States she called [facility] and they never returned her call.  States she then sent them an email and received a reply back that they did not have a RX for her.  Patient states she would like her bowel prep sent to her local pharmacy.  States she did not have a good experience with [facility] as they were not returning her calls.  Suprep sent to her local pharmacy and new prep instructions sent to patient.  RN went over new prep instructions with patient and she verbalized understanding.

## 2023-12-02 ENCOUNTER — Encounter: Payer: Self-pay | Admitting: Internal Medicine

## 2023-12-04 ENCOUNTER — Encounter: Payer: Self-pay | Admitting: Internal Medicine

## 2023-12-04 ENCOUNTER — Ambulatory Visit (AMBULATORY_SURGERY_CENTER): Payer: 59 | Admitting: Internal Medicine

## 2023-12-04 VITALS — BP 101/62 | HR 52 | Temp 98.0°F | Resp 14 | Ht 63.0 in | Wt 116.0 lb

## 2023-12-04 DIAGNOSIS — Z860101 Personal history of adenomatous and serrated colon polyps: Secondary | ICD-10-CM

## 2023-12-04 DIAGNOSIS — Z1211 Encounter for screening for malignant neoplasm of colon: Secondary | ICD-10-CM | POA: Diagnosis present

## 2023-12-04 DIAGNOSIS — Z860100 Personal history of colon polyps, unspecified: Secondary | ICD-10-CM

## 2023-12-04 DIAGNOSIS — Z85038 Personal history of other malignant neoplasm of large intestine: Secondary | ICD-10-CM

## 2023-12-04 MED ORDER — SODIUM CHLORIDE 0.9 % IV SOLN: 500.0000 mL | INTRAVENOUS | Status: DC

## 2023-12-04 NOTE — Progress Notes (Signed)
 Pt states no changes to health hx or medications since previsit.

## 2023-12-04 NOTE — Patient Instructions (Signed)
 Resume previous diet and medications.  Repeat colonoscopy in 10 years per Dr. Marina Goodell recommendation.    YOU HAD AN ENDOSCOPIC PROCEDURE TODAY AT THE [facility]:   Refer to the procedure report that was given to you for any specific questions about what was found during the examination.  If the procedure report does not answer your questions, please call your gastroenterologist to clarify.  If you requested that your care partner not be given the details of your procedure findings, then the procedure report has been included in a sealed envelope for you to review at your convenience later.  YOU SHOULD EXPECT: Some feelings of bloating in the abdomen. Passage of more gas than usual.  Walking can help get rid of the air that was put into your GI tract during the procedure and reduce the bloating. If you had a lower endoscopy (such as a colonoscopy or flexible sigmoidoscopy) you may notice spotting of blood in your stool or on the toilet paper. If you underwent a bowel prep for your procedure, you may not have a normal bowel movement for a few days.  Please Note:  You might notice some irritation and congestion in your nose or some drainage.  This is from the oxygen used during your procedure.  There is no need for concern and it should clear up in a day or so.  SYMPTOMS TO REPORT IMMEDIATELY:  Following lower endoscopy (colonoscopy or flexible sigmoidoscopy):  Excessive amounts of blood in the stool  Significant tenderness or worsening of abdominal pains  Swelling of the abdomen that is new, acute  Fever of 100F or higher  For urgent or emergent issues, a gastroenterologist can be reached at any hour by calling [phone]. Do not use MyChart messaging for urgent concerns.    DIET:  We do recommend a small meal at first, but then you may proceed to your regular diet.  Drink plenty of fluids but you should avoid alcoholic beverages for 24 hours.  ACTIVITY:  You should plan to take  it easy for the rest of today and you should NOT DRIVE or use heavy machinery until tomorrow (because of the sedation medicines used during the test).    FOLLOW UP: Our staff will call the number listed on your records the next business day following your procedure.  We will call around 7:15- 8:00 am to check on you and address any questions or concerns that you may have regarding the information given to you following your procedure. If we do not reach you, we will leave a message.     If any biopsies were taken you will be contacted by phone or by letter within the next 1-3 weeks.  Please call us at [phone] if you have not heard about the biopsies in 3 weeks.    SIGNATURES/CONFIDENTIALITY: You and/or your care partner have signed paperwork which will be entered into your electronic medical record.  These signatures attest to the fact that that the information above on your After Visit Summary has been reviewed and is understood.  Full responsibility of the confidentiality of this discharge information lies with you and/or your care-partner.

## 2023-12-04 NOTE — Progress Notes (Signed)
 Vss nad trans to pacu

## 2023-12-04 NOTE — Op Note (Signed)
 [facility] Patient Name: Shelley Rhodes Procedure Date: 12/04/2023 10:28 AM MRN: [id number] Endoscopist: Wilhemina Bonito. Marina Goodell , MD, [id number] Age: 63 Referring MD:  Date of Birth: [birth year] Gender: Female Account #: [id number] Procedure:                Colonoscopy Indications:              High risk colon cancer surveillance: Personal                            history of non-advanced adenoma. Previous                            examinations 2014, 2020 Medicines:                Monitored Anesthesia Care Procedure:                Pre-Anesthesia Assessment:                           - Prior to the procedure, a History and Physical                            was performed, and patient medications and                            allergies were reviewed. The patient's tolerance of                            previous anesthesia was also reviewed. The risks                            and benefits of the procedure and the sedation                            options and risks were discussed with the patient.                            All questions were answered, and informed consent                            was obtained. Prior Anticoagulants: The patient has                            taken no anticoagulant or antiplatelet agents. ASA                            Grade Assessment: II - A patient with mild systemic                            disease. After reviewing the risks and benefits,                            the patient was deemed in satisfactory condition to  undergo the procedure.                           After obtaining informed consent, the colonoscope                            was passed under direct vision. Throughout the                            procedure, the patient's blood pressure, pulse, and                            oxygen saturations were monitored continuously. The                            CF [id number] #[id number] was introduced through the anus                             and advanced to the the cecum, identified by                            appendiceal orifice and ileocecal valve. The                            ileocecal valve, appendiceal orifice, and rectum                            were photographed. The quality of the bowel                            preparation was good. The colonoscopy was performed                            without difficulty. The patient tolerated the                            procedure well. The bowel preparation used was                            SUPREP via split dose instruction. Scope In: 10:40:51 AM Scope Out: 10:53:02 AM Scope Withdrawal Time: 0 hours 7 minutes 38 seconds  Total Procedure Duration: 0 hours 12 minutes 11 seconds  Findings:                 The entire examined colon appeared normal on direct                            and retroflexion views. Complications:            No immediate complications. Estimated blood loss:                            None. Estimated Blood Loss:     Estimated blood loss: none. Impression:               - The entire examined colon  is normal on direct and                            retroflexion views.                           - No specimens collected. Recommendation:           - Repeat colonoscopy in 10 years for surveillance.                           - Patient has a contact number available for                            emergencies. The signs and symptoms of potential                            delayed complications were discussed with the                            patient. Return to normal activities tomorrow.                            Written discharge instructions were provided to the                            patient.                           - Resume previous diet.                           - Continue present medications. Wilhemina Bonito. Marina Goodell, MD 12/04/2023 10:57:56 AM This report has been signed electronically.

## 2023-12-04 NOTE — Progress Notes (Signed)
 HISTORY OF PRESENT ILLNESS:  Shelley Rhodes is a 63 y.o. female with a history of adenomatous colon polyps.  Now for surveillance colonoscopy  REVIEW OF SYSTEMS:  All non-GI ROS negative except for  Past Medical History:  Diagnosis Date   Appendicitis    Arthritis    knee right    Constipation    occasionally not chronic - uses OTC stool softener    Fibroids    uterine fibroids   Hemorrhoids    History of heartburn    Hyperlipidemia    Hyperlipidemia    Nausea 2001   Right lower quadrant abdominal pain 2001    Past Surgical History:  Procedure Laterality Date   APPENDECTOMY  appendicitis   CESAREAN SECTION     x2   COLONOSCOPY     FINGER SURGERY     right 4th finger   POLYPECTOMY      Social History Shelley Rhodes  reports that she has never smoked. She has never used smokeless tobacco. She reports that she does not drink alcohol and does not use drugs.  family history includes Cancer in her mother; Diabetes in her brother and father; Heart disease in her brother and brother; Hypertension in her father; Lung cancer in her mother; Stroke in her father.  No Known Allergies     PHYSICAL EXAMINATION: Vital signs: BP (!) 146/90   Pulse 79   Temp 98 F (36.7 C)   Resp 10   Ht 5\' 3"  (1.6 m)   Wt 116 lb (52.6 kg)   LMP 10/09/2011 (Approximate)   SpO2 100%   BMI 20.55 kg/m  General: Well-developed, well-nourished, no acute distress HEENT: Sclerae are anicteric, conjunctiva pink. Oral mucosa intact Lungs: Clear Heart: Regular Abdomen: soft, nontender, nondistended, no obvious ascites, no peritoneal signs, normal bowel sounds. No organomegaly. Extremities: No edema Psychiatric: alert and oriented x3. Cooperative     ASSESSMENT:  History of adenomatous polyps   PLAN:   Surveillance colonoscopy

## 2023-12-23 ENCOUNTER — Ambulatory Visit: Payer: 59 | Admitting: Family Medicine

## 2024-01-14 ENCOUNTER — Other Ambulatory Visit (HOSPITAL_COMMUNITY): Payer: Self-pay | Admitting: Physical Medicine and Rehabilitation

## 2024-01-14 ENCOUNTER — Ambulatory Visit (HOSPITAL_COMMUNITY)
Admission: RE | Admit: 2024-01-14 | Discharge: 2024-01-14 | Disposition: A | Discharge status: Home / Self Care | Source: Ambulatory Visit | Attending: Vascular Surgery | Admitting: Vascular Surgery

## 2024-01-14 DIAGNOSIS — M79605 Pain in left leg: Secondary | ICD-10-CM | POA: Diagnosis present

## 2024-03-24 ENCOUNTER — Ambulatory Visit: Payer: 59 | Admitting: Family Medicine

## 2024-03-24 ENCOUNTER — Encounter: Payer: Self-pay | Admitting: Family Medicine

## 2024-03-24 DIAGNOSIS — R739 Hyperglycemia, unspecified: Secondary | ICD-10-CM | POA: Diagnosis not present

## 2024-03-24 DIAGNOSIS — E785 Hyperlipidemia, unspecified: Secondary | ICD-10-CM | POA: Diagnosis not present

## 2024-03-24 LAB — POCT GLYCOSYLATED HEMOGLOBIN (HGB A1C): Hemoglobin A1C: 6.1 % — AB (ref 4.0–5.6)

## 2024-03-24 NOTE — Progress Notes (Signed)
 Established Patient Office Visit  Subjective   Patient ID: Shelley Rhodes, female    DOB: May 29, 1961  Age: 63 y.o. MRN: [id number]  Chief Complaint  Patient presents with   Medical Management of Chronic Issues    HPI   Shelley Rhodes is seen for medical follow-up.  She has history of borderline diabetes range blood sugars.  Her A1c back in December 6.5%.  She has made some dietary changes and increase straightness with diet since then.  Has had some recent knee issues which have limited her walking somewhat.  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  Recovering well.  Hyperlipidemia treated with rosuvastatin  10 mg daily.  Tolerating well.  Family history of CAD in her brother.  Patient had CT morphology study 2022 which showed coronary calcium  score of 0.  She has never smoked.  Past Medical History:  Diagnosis Date   Appendicitis    Arthritis    knee right    Constipation    occasionally not chronic - uses OTC stool softener    Fibroids    uterine fibroids   Hemorrhoids    History of heartburn    Hyperlipidemia    Hyperlipidemia    Nausea 2001   Right lower quadrant abdominal pain 2001   Past Surgical History:  Procedure Laterality Date   APPENDECTOMY  appendicitis   CESAREAN SECTION     x2   COLONOSCOPY     FINGER SURGERY     right 4th finger   POLYPECTOMY      reports that she has never smoked. She has never used smokeless tobacco. She reports that she does not drink alcohol and does not use drugs. family history includes Cancer in her mother; Diabetes in her brother and father; Heart disease in her brother and brother; Hypertension in her father; Lung cancer in her mother; Stroke in her father. No Known Allergies  Review of Systems  Constitutional:  Negative for malaise/fatigue.  Eyes:  Negative for blurred vision.  Respiratory:  Negative for shortness of breath.   Cardiovascular:  Negative for chest pain.  Neurological:  Negative for  dizziness, weakness and headaches.      Objective:     LMP 10/09/2011 (Approximate)    Physical Exam Vitals reviewed.  Constitutional:      Appearance: She is well-developed.   Eyes:     Pupils: Pupils are equal, round, and reactive to light.   Neck:     Thyroid : No thyromegaly.     Vascular: No JVD.   Cardiovascular:     Rate and Rhythm: Normal rate.     Heart sounds:     No gallop.     Comments: Occasional premature beat Pulmonary:     Effort: Pulmonary effort is normal. No respiratory distress.     Breath sounds: Normal breath sounds. No wheezing or rales.   Musculoskeletal:     Cervical back: Neck supple.   Neurological:     Mental Status: She is alert.      Results for orders placed or performed in visit on 03/24/24  POC HgB A1c  Result Value Ref Range   Hemoglobin A1C 6.1 (A) 4.0 - 5.6 %   HbA1c POC (<> result, manual entry)     HbA1c, POC (prediabetic range)     HbA1c, POC (controlled diabetic range)        The 10-year ASCVD risk score (Arnett DK, et al., 2019) is: 5.3%  Assessment & Plan:   #1 hyperglycemia with borderline diabetes.  A1c today improved to 6.1%.  Continue lower glycemic diet.  Try to establish more consistent exercise.  Set up 32-month follow-up and recheck them  #2 hyperlipidemia.  Family history of CAD.  Previous coronary calcium  score of 0.  Recommend continue rosuvastatin  10 mg daily.  Continue low saturated fat diet.  Establish more consistent exercise.  Patient has questions regarding LP(a).  Consider at time of physical in December although she did have recent coronary calcium  score 3 years ago 0  Glean Lamy, MD

## 2024-03-24 NOTE — Patient Instructions (Signed)
A1C improved today to 6.1%.    Let's plan on 6 month follow up.

## 2024-07-14 ENCOUNTER — Telehealth: Payer: Self-pay

## 2024-07-14 NOTE — Telephone Encounter (Signed)
 Copied from CRM [id number]. Topic: Appointments - Transfer of Care >> Jul 14, 2024 10:17 AM Tinnie C wrote: Pt is requesting to transfer FROM: [facility] Pt is requesting to transfer TO: [facility] Reason for requested transfer: Would prefer a female and OBGYN recommended [place]. It is the responsibility of the team the patient would like to transfer to (Dr. Wendolyn) to reach out to the patient if for any reason this transfer is not acceptable.

## 2024-07-17 NOTE — Telephone Encounter (Signed)
 Please see TOC request from patient and advise

## 2024-07-22 ENCOUNTER — Ambulatory Visit (INDEPENDENT_AMBULATORY_CARE_PROVIDER_SITE_OTHER)

## 2024-07-22 DIAGNOSIS — Z23 Encounter for immunization: Secondary | ICD-10-CM | POA: Diagnosis not present

## 2024-09-17 ENCOUNTER — Encounter: Payer: Self-pay | Admitting: Family Medicine

## 2024-09-17 ENCOUNTER — Ambulatory Visit (INDEPENDENT_AMBULATORY_CARE_PROVIDER_SITE_OTHER)

## 2024-09-17 ENCOUNTER — Ambulatory Visit: Attending: Family Medicine

## 2024-09-17 ENCOUNTER — Ambulatory Visit: Payer: Self-pay | Admitting: Family Medicine

## 2024-09-17 ENCOUNTER — Ambulatory Visit: Admitting: Family Medicine

## 2024-09-17 VITALS — BP 114/72 | HR 61 | Temp 97.0°F | Ht 63.0 in | Wt 120.4 lb

## 2024-09-17 DIAGNOSIS — Z23 Encounter for immunization: Secondary | ICD-10-CM

## 2024-09-17 DIAGNOSIS — M25572 Pain in left ankle and joints of left foot: Secondary | ICD-10-CM

## 2024-09-17 DIAGNOSIS — R001 Bradycardia, unspecified: Secondary | ICD-10-CM

## 2024-09-17 DIAGNOSIS — R7303 Prediabetes: Secondary | ICD-10-CM | POA: Insufficient documentation

## 2024-09-17 DIAGNOSIS — M25472 Effusion, left ankle: Secondary | ICD-10-CM | POA: Diagnosis not present

## 2024-09-17 DIAGNOSIS — R002 Palpitations: Secondary | ICD-10-CM

## 2024-09-17 DIAGNOSIS — L720 Epidermal cyst: Secondary | ICD-10-CM

## 2024-09-17 DIAGNOSIS — Z0000 Encounter for general adult medical examination without abnormal findings: Secondary | ICD-10-CM

## 2024-09-17 DIAGNOSIS — R0789 Other chest pain: Secondary | ICD-10-CM

## 2024-09-17 DIAGNOSIS — E7800 Pure hypercholesterolemia, unspecified: Secondary | ICD-10-CM

## 2024-09-17 LAB — COMPREHENSIVE METABOLIC PANEL WITH GFR
ALT: 24 U/L (ref 0–35)
AST: 21 U/L (ref 0–37)
Albumin: 4.7 g/dL (ref 3.5–5.2)
Alkaline Phosphatase: 70 U/L (ref 39–117)
BUN: 17 mg/dL (ref 6–23)
CO2: 26 meq/L (ref 19–32)
Calcium: 9.5 mg/dL (ref 8.4–10.5)
Chloride: 104 meq/L (ref 96–112)
Creatinine, Ser: 0.66 mg/dL (ref 0.40–1.20)
GFR: 94.16 mL/min (ref >60.00)
Glucose, Bld: 111 mg/dL — ABNORMAL HIGH (ref 70–99)
Potassium: 3.9 meq/L (ref 3.5–5.1)
Sodium: 140 meq/L (ref 135–145)
Total Bilirubin: 0.8 mg/dL (ref 0.2–1.2)
Total Protein: 7.3 g/dL (ref 6.0–8.3)

## 2024-09-17 LAB — LIPID PANEL
Cholesterol: 218 mg/dL — ABNORMAL HIGH (ref 0–200)
HDL: 71.2 mg/dL (ref >39.00)
LDL Cholesterol: 126 mg/dL — ABNORMAL HIGH (ref 0–99)
NonHDL: 147.21
Total CHOL/HDL Ratio: 3
Triglycerides: 105 mg/dL (ref 0.0–149.0)
VLDL: 21 mg/dL (ref 0.0–40.0)

## 2024-09-17 LAB — CBC WITH DIFFERENTIAL/PLATELET
Basophils Absolute: 0 K/uL (ref 0.0–0.1)
Basophils Relative: 0.6 % (ref 0.0–3.0)
Eosinophils Absolute: 0.1 K/uL (ref 0.0–0.7)
Eosinophils Relative: 1.7 % (ref 0.0–5.0)
HCT: 43 % (ref 36.0–46.0)
Hemoglobin: 14.2 g/dL (ref 12.0–15.0)
Lymphocytes Relative: 28.4 % (ref 12.0–46.0)
Lymphs Abs: 1.8 K/uL (ref 0.7–4.0)
MCHC: 33 g/dL (ref 30.0–36.0)
MCV: 89.6 fl (ref 78.0–100.0)
Monocytes Absolute: 0.4 K/uL (ref 0.1–1.0)
Monocytes Relative: 5.5 % (ref 3.0–12.0)
Neutro Abs: 4.1 K/uL (ref 1.4–7.7)
Neutrophils Relative %: 63.8 % (ref 43.0–77.0)
Platelets: 305 K/uL (ref 150.0–400.0)
RBC: 4.8 Mil/uL (ref 3.87–5.11)
RDW: 13.8 % (ref 11.5–15.5)
WBC: 6.4 K/uL (ref 4.0–10.5)

## 2024-09-17 LAB — TSH: TSH: 1.98 u[IU]/mL (ref 0.35–5.50)

## 2024-09-17 LAB — HEMOGLOBIN A1C: Hgb A1c MFr Bld: 6.1 % (ref 4.6–6.5)

## 2024-09-17 MED ORDER — TRETINOIN 0.05 % EX CREA: TOPICAL_CREAM | Freq: Every day | CUTANEOUS | 3 refills | Status: AC

## 2024-09-17 MED ORDER — ROSUVASTATIN CALCIUM 10 MG PO TABS: 10.0000 mg | ORAL_TABLET | Freq: Every day | ORAL | 3 refills | Status: DC

## 2024-09-17 NOTE — Patient Instructions (Signed)
 It was very nice to see you today!  Happy Holidays  Magnesium glycinate   PLEASE NOTE:  If you had any lab tests please let us  know if you have not heard back within a few days. You may see your results on MyChart before we have a chance to review them but we will give you a call once they are reviewed by us . If we ordered any referrals today, please let us  know if you have not heard from their office within the next week.   Please try these tips to maintain a healthy lifestyle:  Eat most of your calories during the day when you are active. Eliminate processed foods including packaged sweets (pies, cakes, cookies), reduce intake of potatoes, white bread, white pasta, and white rice. Look for whole grain options, oat flour or almond flour.  Each meal should contain half fruits/vegetables, one quarter protein, and one quarter carbs (no bigger than a computer mouse).  Cut down on sweet beverages. This includes juice, soda, and sweet tea. Also watch fruit intake, though this is a healthier sweet option, it still contains natural sugar! Limit to 3 servings daily.  Drink at least 1 glass of water with each meal and aim for at least 8 glasses per day  Exercise at least 150 minutes every week.

## 2024-09-17 NOTE — Progress Notes (Signed)
 Labs stable except cholesterol increased-had she run out of or missed doses of rosuvastatin ?  If not, then should increase to 20mg  daily(send new rx if yes)

## 2024-09-17 NOTE — Progress Notes (Unsigned)
 EP to read.

## 2024-09-17 NOTE — Progress Notes (Signed)
 Phone [phone]   Subjective:   Patient is a 63 y.o. female presenting for annual physical.    Chief Complaint  Patient presents with   Annual Exam    Pt is here for CPE   Foot Injury    Pt fell yesterday and hurt her left foot    Discussed the use of AI scribe software for clinical note transcription with the patient, who gave verbal consent to proceed.  History of Present Illness Shelley Rhodes is a 63 year old female who presents for an annual physical exam-TOC  She experiences episodes of irregular heartbeats, particularly at night, which have been occurring since last month. She woke up around 2 AM with a sensation of her heart beating irregularly, lasting for one to two hours, without associated pain, dizziness, or shortness of breath. She has used an Scientist, Physiological to monitor her heart rate, noting it dropped below 50 bpm on two occasions during the night. She is concerned due to her family history of cardiovascular issues, as her father passed away from a stroke, and her brothers have had heart surgeries.  She has a history of prediabetes and is currently managing it through diet and exercise. She is vigilant about her diet and exercises by walking two to three times a week. She takes Crestor  (rosuvastatin ) 10 mg daily for cholesterol management.  She reports experiencing muscle cramps at night, particularly in her legs. She takes magnesium supplements to help alleviate these symptoms. No major headaches, dizziness, passing out, blurry vision, double vision, coughing, wheezing, shortness of breath, vomiting, diarrhea, constipation, heartburn, trouble urinating, vaginal bleeding, or unusual muscle aches. She reports occasional leg cramps at night.  She recently experienced a fall yesterday, resulting in ankle pain. She describes stepping on her husband's shoes, leading to the fall, and reports pain in her ankle, which was more severe the day after the incident.  Annual-sees Dr.  Lattie    See problem oriented charting- ROS- ROS: Gen: no fever, chills  Skin: forehead bumps-has seen derm in past and med not covered by insurance.  Doesn't bother her, but friends comment on it ENT: no ear pain, ear drainage, nasal congestion, rhinorrhea, sinus pressure, sore throat Eyes: no blurry vision, double vision Resp: no cough, wheeze,SOB CV: hpi GI: no heartburn, n/v/d/c, abd pain GU: no dysuria, urgency, frequency, hematuria MSK: hpi Neuro: no dizziness, headache, weakness, vertigo Psych: no depression, anxiety, insomnia, SI   The following were reviewed and entered/updated in epic: Past Medical History:  Diagnosis Date   Appendicitis    Arthritis    knee right    Constipation    occasionally not chronic - uses OTC stool softener    Fibroids    uterine fibroids   Hemorrhoids    History of heartburn    Hyperlipidemia    Hyperlipidemia    Nausea 2001   Prediabetes    Right lower quadrant abdominal pain 2001   Patient Active Problem List   Diagnosis Date Noted   Prediabetes 09/17/2024   Hyperglycemia 07/01/2023   Chest pain 08/27/2011   Palpitations 08/27/2011   Hyperlipidemia 08/04/2009   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   Past Surgical History:  Procedure Laterality Date   APPENDECTOMY  appendicitis   CESAREAN SECTION     x2   COLONOSCOPY     FINGER SURGERY     right 4th finger   POLYPECTOMY      Family History  Problem Relation Age of Onset  Cancer Mother        Lung   Lung cancer Mother    Stroke Father    Hypertension Father    Diabetes Father    Heart disease Brother 106   Diabetes Brother    Heart disease Brother 42   Colon cancer Neg Hx    Colon polyps Neg Hx    Esophageal cancer Neg Hx    Rectal cancer Neg Hx    Stomach cancer Neg Hx    Pancreatic cancer Neg Hx     Medications- reviewed and updated Current Outpatient Medications  Medication Sig Dispense Refill   Cholecalciferol (VITAMIN D-3) 1000  UNITS CAPS Take 2,000 Units by mouth daily.      Magnesium 400 MG CAPS Take 400 mg by mouth as needed.     mupirocin  ointment (BACTROBAN ) 2 % Apply 1 application  topically 2 (two) times daily. 22 g 0   tretinoin (RETIN-A) 0.05 % cream Apply topically at bedtime. 45 g 3   rosuvastatin  (CRESTOR ) 10 MG tablet Take 1 tablet (10 mg total) by mouth daily. 90 tablet 3   No current facility-administered medications for this visit.    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 DP 2+B LUNGS: CTAB. No wheezes ABDOMEN:  BS+, soft, NTND, No HSM, no masses EXT:  no edema MSK: . MS 5/5 all 4.  L ankle-swollen lateral maleolus.  Bruising.  Some TTP inf area.  No TTP 5th metatarsal NEURO: A&O x3.  CN II-XII intact.  PSYCH: normal mood. Good eye contact   EKG: nsr, no st changes  Skin-milia type bumps forehead    Assessment and Plan   Health Maintenance counseling: 1. Anticipatory guidance: Patient counseled regarding regular dental exams q6 months, eye exams,  avoiding smoking and second hand smoke, limiting alcohol to 1 beverage per day, no illicit drugs.   2. Risk factor reduction:  Advised patient of need for regular exercise and diet rich and fruits and vegetables to reduce risk of heart attack and stroke. Exercise- +.  Wt Readings from Last 3 Encounters:  09/17/24 120 lb 6 oz (54.6 kg)  12/04/23 116 lb (52.6 kg)  11/05/23 116 lb (52.6 kg)   3.  Immunizations/screenings/ancillary studies Immunization History  Administered Date(s) Administered   Influenza Split 09/20/2011   Influenza Whole 08/22/2009, 08/08/2010   Influenza, Seasonal, Injecte, Preservative Fre 09/15/2012, 07/01/2023, 07/22/2024   Influenza,inj,Quad PF,6+ Mos 07/25/2015, 08/06/2016, 08/27/2017, 09/12/2020, 08/25/2021, 06/15/2022   PFIZER(Purple Top)SARS-COV-2 Vaccination 12/29/2019, 01/19/2020   PNEUMOCOCCAL CONJUGATE-20 09/17/2024   Td 08/22/2010   Tdap 02/16/2020   Zoster Recombinant(Shingrix ) 01/08/2017, 04/05/2017   Health Maintenance Due  Topic Date Due   HIV Screening  Never done   Cervical Cancer Screening (HPV/Pap Cotest)  07/09/2019   Mammogram  12/20/2023    4. Cervical cancer screening- gyn 5. Breast cancer screening-  mammogram gyn 6. Colon cancer screening - 12/03/33 7. Skin cancer screening- advised regular sunscreen use. Denies worrisome, changing, or new skin lesions.  8. Birth control/STD check- n/a 9. Osteoporosis screening- gyn 10. Smoking associated screening - non smoker  Wellness examination -     Lipid panel -     Comprehensive metabolic panel with GFR -     CBC with Differential/Platelet -     Hemoglobin A1c -     TSH  Pure hypercholesterolemia -     Rosuvastatin  Calcium ; Take 1 tablet (10 mg total) by mouth daily.  Dispense: 90 tablet; Refill: 3 -     Lipid panel  Prediabetes  Other chest pain -     EKG 12-Lead  Palpitations -     LONG TERM MONITOR (3-14 DAYS); Future  Bradycardia -     LONG TERM MONITOR (3-14 DAYS); Future  Pain and swelling of left ankle -     DG Ankle Complete Left; Future  Immunization due -     Pneumococcal conjugate vaccine 20-valent  Milia  Other orders -     Tretinoin; Apply topically at bedtime.  Dispense: 45 g; Refill: 3    Assessment and Plan Assessment & Plan Palpitations and bradycardia   She experiences intermittent palpitations and bradycardia, especially at night, with  heart rates below 50 bpm. There is no dizziness, syncope, or chest pain. A previous calcium  score from 2022 was normal. An event monitor is ordered for two weeks to assess heart rhythm. She is instructed on its use, including no showering for 24 hours after application and avoiding soaking or swimming while wearing it.  Pure hypercholesterolemia   Her hypercholesterolemia is managed with rosuvastatin  10 mg daily. She will continue rosuvastatin  10 mg daily, and a lipid panel is ordered to assess current cholesterol levels.  Prediabetes   Prediabetes is managed with diet and exercise. The potential need for medication if A1c is not controlled with lifestyle changes was discussed. An A1c test is ordered to assess current glycemic control. She is encouraged to increase physical activity to five days a week, 30 minutes per session.  Left ankle pain and swelling after fall   She has left ankle pain and swelling following a fall, with no significant tenderness on examination, suggesting a sprain rather than a fracture. She can ambulate with assistance. An x-ray of the left ankle is ordered to rule out a fracture. She is advised to ice and elevate the ankle and to use ibuprofen or acetaminophen for pain management. Ankle exercises are recommended as pain allows.  Milia/acne-retin a cream  General Health Maintenance   Routine health maintenance was discussed. Her mammogram and Pap smear are up to date. The pneumonia vaccine was administered as per updated guidelines for age 72 and above. She is encouraged to maintain regular exercise and a healthy diet and advised on sunscreen use for skin protection.     Recommended follow up: Return in about 6 months (around 03/18/2025) for cholesterol.  Lab/Order associations: fasting  Jenkins CHRISTELLA Carrel, MD      [1] No Known Allergies

## 2024-09-18 ENCOUNTER — Other Ambulatory Visit: Payer: Self-pay

## 2024-09-18 MED ORDER — ROSUVASTATIN CALCIUM 20 MG PO TABS: 20.0000 mg | ORAL_TABLET | Freq: Every day | ORAL | 3 refills | Status: AC

## 2024-09-18 NOTE — Progress Notes (Signed)
 Pt has read results [facility]

## 2024-10-07 ENCOUNTER — Encounter: Admitting: Family Medicine

## 2024-10-18 DIAGNOSIS — R001 Bradycardia, unspecified: Secondary | ICD-10-CM | POA: Diagnosis not present

## 2024-10-18 DIAGNOSIS — R002 Palpitations: Secondary | ICD-10-CM | POA: Diagnosis not present

## 2024-10-18 NOTE — Progress Notes (Signed)
 Heart monitor ok

## 2024-10-29 ENCOUNTER — Encounter: Admitting: Family Medicine

## 2025-03-19 ENCOUNTER — Ambulatory Visit: Admitting: Family Medicine
